# Patient Record
Sex: Female | Born: 1950
Health system: Southern US, Community
[De-identification: ages and names within clinical notes are randomized; demographics above are authoritative.]

## PROBLEM LIST (undated history)

## (undated) DIAGNOSIS — I10 Essential (primary) hypertension: Secondary | ICD-10-CM

## (undated) DIAGNOSIS — E119 Type 2 diabetes mellitus without complications: Secondary | ICD-10-CM

## (undated) DIAGNOSIS — C73 Malignant neoplasm of thyroid gland: Secondary | ICD-10-CM

## (undated) DIAGNOSIS — E785 Hyperlipidemia, unspecified: Secondary | ICD-10-CM

## (undated) HISTORY — PX: TRIGGER FINGER RELEASE: SHX641

## (undated) HISTORY — PX: JOINT REPLACEMENT: SHX530

## (undated) HISTORY — DX: Type 2 diabetes mellitus without complications: E11.9

## (undated) HISTORY — PX: HAND SURGERY: SHX662

## (undated) HISTORY — DX: Essential (primary) hypertension: I10

## (undated) HISTORY — PX: TONSILLECTOMY: SUR1361

## (undated) HISTORY — DX: Hyperlipidemia, unspecified: E78.5

## (undated) HISTORY — DX: Malignant neoplasm of thyroid gland: C73

## (undated) HISTORY — PX: ABDOMINAL HYSTERECTOMY: SHX81

---

## 2009-10-02 HISTORY — PX: TOTAL THYROIDECTOMY: SHX2547

## 2009-10-02 HISTORY — PX: CHOLECYSTECTOMY, LAPAROSCOPIC: SHX56

## 2014-04-13 LAB — HEPATIC FUNCTION PANEL
ALK PHOS: 89 (ref 25–125)
ALT: 20 (ref 7–35)
AST: 24 (ref 13–35)
BILIRUBIN, TOTAL: 0.4

## 2014-04-13 LAB — LIPID PANEL
Cholesterol: 199 (ref 0–200)
HDL: 39 (ref 35–70)
LDL Cholesterol: 46
TRIGLYCERIDES: 164 — AB (ref 40–160)

## 2014-04-13 LAB — MICROALBUMIN, URINE: MICROALB UR: 7.3

## 2014-04-13 LAB — BASIC METABOLIC PANEL
CREATININE: 0.6 (ref 0.5–1.1)
GLUCOSE: 109
Potassium: 4 (ref 3.4–5.3)
SODIUM: 142 (ref 137–147)

## 2014-04-13 LAB — TSH: TSH: 1.84 (ref 0.41–5.90)

## 2016-11-03 LAB — VITAMIN D 25 HYDROXY (VIT D DEFICIENCY, FRACTURES): VIT D 25 HYDROXY: 66

## 2017-02-07 LAB — BASIC METABOLIC PANEL
BUN: 11 (ref 4–21)
Creatinine: 0.8 (ref 0.5–1.1)
Glucose: 141
Potassium: 4.9 (ref 3.4–5.3)
Sodium: 141 (ref 137–147)

## 2017-02-07 LAB — LIPID PANEL
CHOLESTEROL: 152 (ref 0–200)
HDL: 40 (ref 35–70)
LDL Cholesterol: 225
TRIGLYCERIDES: 225 — AB (ref 40–160)

## 2017-02-07 LAB — HEPATIC FUNCTION PANEL
ALT: 31 (ref 7–35)
AST: 34 (ref 13–35)
Alkaline Phosphatase: 58 (ref 25–125)
Bilirubin, Total: 0.6

## 2017-02-07 LAB — TSH: TSH: 2.35 (ref 0.41–5.90)

## 2017-02-07 LAB — HEMOGLOBIN A1C: Hemoglobin A1C: 6.7

## 2017-02-28 ENCOUNTER — Encounter (INDEPENDENT_AMBULATORY_CARE_PROVIDER_SITE_OTHER): Payer: Self-pay

## 2017-02-28 ENCOUNTER — Ambulatory Visit (INDEPENDENT_AMBULATORY_CARE_PROVIDER_SITE_OTHER): Payer: Medicare Other | Admitting: Neurology

## 2017-02-28 ENCOUNTER — Ambulatory Visit (INDEPENDENT_AMBULATORY_CARE_PROVIDER_SITE_OTHER): Payer: Self-pay | Admitting: Neurology

## 2017-02-28 DIAGNOSIS — G5600 Carpal tunnel syndrome, unspecified upper limb: Secondary | ICD-10-CM

## 2017-02-28 DIAGNOSIS — G5603 Carpal tunnel syndrome, bilateral upper limbs: Secondary | ICD-10-CM

## 2017-02-28 DIAGNOSIS — M25512 Pain in left shoulder: Secondary | ICD-10-CM

## 2017-02-28 DIAGNOSIS — Z0289 Encounter for other administrative examinations: Secondary | ICD-10-CM

## 2017-02-28 NOTE — Progress Notes (Signed)
See procedure note.

## 2017-03-01 NOTE — Progress Notes (Signed)
Full Name: Laura Davis Gender: Female MRN #: [id number] Date of Birth: November 28, 1950    Visit Date: 02/28/2017 10:53 Age: 66 Years 1 Months Old Examining Physician: Sarina Ill, MD  Referring Physician: Marshall Cork, MD    History: This is a 66 year old patient here for evaluation of pain in the left shoulder.  Summary: Nerve Conduction Studies were performed on the bilateral upper extremities.  The right median APB motor nerve showed prolonged distal onset latency (4.4 ms, N<4.4). The right Median 2nd Digit orthodromic sensory nerve showed prolonged distal peak latency (3.75 ms, N<3.4). The left median APB motor nerve showed prolonged distal onset latency (4.6 ms, N<4.4). The right median/ulnar (palm) comparison nerve showed prolonged distal peak latency (Median Palm, 2.76 ms, N<2.2) and abnormal peak latency difference (Median Palm-Ulnar Palm, 1.0 ms, N<0.4) with a relative median delay. The left median/ulnar (palm) comparison nerve showed prolonged distal peak latency (Median Palm, 2.5 ms, N<2.2) and abnormal peak latency difference (Median Palm-Ulnar Palm, 0.9 ms, N<0.4) with a relative median delay.The remaining nerves (as detailed in the following tables) were within normal limits. The muscles (as detailed in the following tables) were within normal limits.    Conclusion: There is electrophysiologic evidence of bilateral, moderately-severe median neuropathy at the wrist. No suggestion of polyneuropathy or radiculopathy.   Cc: Dr. Rozelle Logan M.D.  [facility] [place], [place] [address] Tel: [phone] Fax: [phone]        [facility]    Nerve / Sites Rec. Site Latency Ref. Amplitude Ref. Rel Amp Segments Distance Velocity Ref. Area    ms ms mV mV %  cm m/s m/s mVms  L Median - APB     Wrist APB 4.4 ?4.4 8.9 ?4.0 100 Wrist - APB 7   31.5     Upper arm APB 8.4  8.3  93.8 Upper arm - Wrist 22 54 ?49 30.1  R Median - APB     Wrist APB 4.6 ?4.4 4.2 ?4.0 100 Wrist - APB 7   13.8     Upper arm APB 9.2  3.7  89.8 Upper arm - Wrist 23 51 ?49 12.1  L Ulnar - ADM     Wrist ADM 2.8 ?3.3 9.7 ?6.0 100 Wrist - ADM 7   28.9     B.Elbow ADM 6.6  9.0  93.3 B.Elbow - Wrist 20 53 ?49 28.3     A.Elbow ADM 8.5  8.2  90.9 A.Elbow - B.Elbow 10 52 ?49 27.1     Axilla ADM 11.4  7.1  86 Axilla - A.Elbow 17 59  24.3     Supraclav fossa ADM 14.1  6.9  98.4 Supraclav fossa - Axilla 15 56  23.6         A.Elbow - Wrist               SNC    Nerve / Sites Rec. Site Peak Lat Ref.  Amp Ref. Segments Distance Peak Diff Ref.    ms ms V V  cm ms ms  L Median, Ulnar - Transcarpal comparison     Median Palm Wrist 2.76 ?2.20 29 ?35 Median Palm - Wrist 8       Ulnar Palm Wrist 1.72 ?2.20 8 ?12 Ulnar Palm - Wrist 8          Median Palm - Ulnar Palm  1.0 ?0.4  R Median, Ulnar - Transcarpal comparison     Median  Palm Wrist 2.50 ?2.20 48 ?35 Median Palm - Wrist 8       Ulnar Palm Wrist 1.61 ?2.20 21 ?12 Ulnar Palm - Wrist 8          Median Palm - Ulnar Palm  0.9 ?0.4  L Median - Orthodromic (Dig II, Mid palm)     Dig II Wrist 3.75 ?3.40 11 ?10 Dig II - Wrist 13    R Median - Orthodromic (Dig II, Mid palm)     Dig II Wrist 3.33 ?3.40 13 ?10 Dig II - Wrist 13    L Ulnar - Orthodromic, (Dig V, Mid palm)     Dig V Wrist 2.60 ?3.10 5 ?5 Dig V - Wrist 11    R Ulnar - Orthodromic, (Dig V, Mid palm)     Dig V Wrist 2.50 ?3.10 8 ?5 Dig V - Wrist 50                   F  Wave    Nerve F Lat Ref.   ms ms  L Ulnar - ADM 28.2 ?32.0       EMG full       EMG Summary Table    Spontaneous MUAP Recruitment  Muscle IA Fib PSW Fasc Other Amp Dur. Poly Pattern  L. Deltoid Normal None None None _______ Normal Normal Normal Normal  L. Triceps brachii Normal None None None _______ Normal Normal Normal Normal  L. Pronator teres Normal None None None _______ Normal Normal Normal Normal  L. First dorsal interosseous Normal None None None _______ Normal  Normal Normal Normal  L. Opponens pollicis Normal None None None _______ Normal Normal Normal Normal  L. infraspinatus and L. supraspinatus  Normal None None None _______ Normal Normal Normal Normal  L. Cervical paraspinals (low) Normal None None None _______ Normal Normal Normal Normal

## 2017-03-01 NOTE — Procedures (Signed)
Full Name: Laura Davis Gender: Female MRN #: [id number] Date of Birth: [birth year]    Visit Date: 02/28/2017 10:53 Age: 66 Years 1 Months Old Examining Physician: Sarina Ill, MD  Referring Physician: Marshall Cork, MD    History: This is a 66 year old patient here for evaluation of pain in the left shoulder.  Summary: Nerve Conduction Studies were performed on the bilateral upper extremities.  The right median APB motor nerve showed prolonged distal onset latency (4.4 ms, N<4.4). The right Median 2nd Digit orthodromic sensory nerve showed prolonged distal peak latency (3.75 ms, N<3.4). The left median APB motor nerve showed prolonged distal onset latency (4.6 ms, N<4.4). The right median/ulnar (palm) comparison nerve showed prolonged distal peak latency (Median Palm, 2.76 ms, N<2.2) and abnormal peak latency difference (Median Palm-Ulnar Palm, 1.0 ms, N<0.4) with a relative median delay. The left median/ulnar (palm) comparison nerve showed prolonged distal peak latency (Median Palm, 2.5 ms, N<2.2) and abnormal peak latency difference (Median Palm-Ulnar Palm, 0.9 ms, N<0.4) with a relative median delay.The remaining nerves (as detailed in the following tables) were within normal limits. The muscles (as detailed in the following tables) were within normal limits.    Conclusion: There is electrophysiologic evidence of bilateral, moderately-severe median neuropathy at the wrist. No suggestion of polyneuropathy or radiculopathy.   Cc: Dr. Rozelle Logan M.D.  [facility] [place], [place] [address] Tel: [phone] Fax: [phone]        [facility]    Nerve / Sites Rec. Site Latency Ref. Amplitude Ref. Rel Amp Segments Distance Velocity Ref. Area    ms ms mV mV %  cm m/s m/s mVms  L Median - APB     Wrist APB 4.4 ?4.4 8.9 ?4.0 100 Wrist - APB 7   31.5     Upper arm APB 8.4  8.3  93.8 Upper arm - Wrist 22 54 ?49 30.1  R Median - APB     Wrist APB 4.6 ?4.4 4.2 ?4.0 100 Wrist - APB 7   13.8     Upper arm APB 9.2  3.7  89.8 Upper arm - Wrist 23 51 ?49 12.1  L Ulnar - ADM     Wrist ADM 2.8 ?3.3 9.7 ?6.0 100 Wrist - ADM 7   28.9     B.Elbow ADM 6.6  9.0  93.3 B.Elbow - Wrist 20 53 ?49 28.3     A.Elbow ADM 8.5  8.2  90.9 A.Elbow - B.Elbow 10 52 ?49 27.1     Axilla ADM 11.4  7.1  86 Axilla - A.Elbow 17 59  24.3     Supraclav fossa ADM 14.1  6.9  98.4 Supraclav fossa - Axilla 15 56  23.6         A.Elbow - Wrist               SNC    Nerve / Sites Rec. Site Peak Lat Ref.  Amp Ref. Segments Distance Peak Diff Ref.    ms ms V V  cm ms ms  L Median, Ulnar - Transcarpal comparison     Median Palm Wrist 2.76 ?2.20 29 ?35 Median Palm - Wrist 8       Ulnar Palm Wrist 1.72 ?2.20 8 ?12 Ulnar Palm - Wrist 8          Median Palm - Ulnar Palm  1.0 ?0.4  R Median, Ulnar - Transcarpal comparison     Median  Palm Wrist 2.50 ?2.20 48 ?35 Median Palm - Wrist 8       Ulnar Palm Wrist 1.61 ?2.20 21 ?12 Ulnar Palm - Wrist 8          Median Palm - Ulnar Palm  0.9 ?0.4  L Median - Orthodromic (Dig II, Mid palm)     Dig II Wrist 3.75 ?3.40 11 ?10 Dig II - Wrist 13    R Median - Orthodromic (Dig II, Mid palm)     Dig II Wrist 3.33 ?3.40 13 ?10 Dig II - Wrist 13    L Ulnar - Orthodromic, (Dig V, Mid palm)     Dig V Wrist 2.60 ?3.10 5 ?5 Dig V - Wrist 11    R Ulnar - Orthodromic, (Dig V, Mid palm)     Dig V Wrist 2.50 ?3.10 8 ?5 Dig V - Wrist 71                   F  Wave    Nerve F Lat Ref.   ms ms  L Ulnar - ADM 28.2 ?32.0       EMG full       EMG Summary Table    Spontaneous MUAP Recruitment  Muscle IA Fib PSW Fasc Other Amp Dur. Poly Pattern  L. Deltoid Normal None None None _______ Normal Normal Normal Normal  L. Triceps brachii Normal None None None _______ Normal Normal Normal Normal  L. Pronator teres Normal None None None _______ Normal Normal Normal Normal  L. First dorsal interosseous Normal None None None _______ Normal  Normal Normal Normal  L. Opponens pollicis Normal None None None _______ Normal Normal Normal Normal  L. infraspinatus and L. supraspinatus  Normal None None None _______ Normal Normal Normal Normal  L. Cervical paraspinals (low) Normal None None None _______ Normal Normal Normal Normal

## 2017-08-09 ENCOUNTER — Encounter: Payer: Self-pay | Admitting: Family Medicine

## 2017-08-09 ENCOUNTER — Ambulatory Visit (INDEPENDENT_AMBULATORY_CARE_PROVIDER_SITE_OTHER): Payer: Medicare Other | Admitting: Family Medicine

## 2017-08-09 VITALS — BP 116/80 | HR 88 | Temp 98.1°F | Resp 16 | Ht 64.0 in | Wt 179.0 lb

## 2017-08-09 DIAGNOSIS — K219 Gastro-esophageal reflux disease without esophagitis: Secondary | ICD-10-CM | POA: Diagnosis not present

## 2017-08-09 DIAGNOSIS — Z7689 Persons encountering health services in other specified circumstances: Secondary | ICD-10-CM | POA: Diagnosis not present

## 2017-08-09 DIAGNOSIS — G5602 Carpal tunnel syndrome, left upper limb: Secondary | ICD-10-CM | POA: Diagnosis not present

## 2017-08-09 DIAGNOSIS — G2581 Restless legs syndrome: Secondary | ICD-10-CM

## 2017-08-09 DIAGNOSIS — Z800 Family history of malignant neoplasm of digestive organs: Secondary | ICD-10-CM

## 2017-08-09 DIAGNOSIS — E119 Type 2 diabetes mellitus without complications: Secondary | ICD-10-CM | POA: Diagnosis not present

## 2017-08-09 DIAGNOSIS — E890 Postprocedural hypothyroidism: Secondary | ICD-10-CM | POA: Diagnosis not present

## 2017-08-09 DIAGNOSIS — Z6830 Body mass index (BMI) 30.0-30.9, adult: Secondary | ICD-10-CM

## 2017-08-09 DIAGNOSIS — I10 Essential (primary) hypertension: Secondary | ICD-10-CM | POA: Diagnosis not present

## 2017-08-09 DIAGNOSIS — G5600 Carpal tunnel syndrome, unspecified upper limb: Secondary | ICD-10-CM | POA: Insufficient documentation

## 2017-08-09 DIAGNOSIS — E669 Obesity, unspecified: Secondary | ICD-10-CM

## 2017-08-09 DIAGNOSIS — M810 Age-related osteoporosis without current pathological fracture: Secondary | ICD-10-CM

## 2017-08-09 DIAGNOSIS — E785 Hyperlipidemia, unspecified: Secondary | ICD-10-CM

## 2017-08-09 DIAGNOSIS — E559 Vitamin D deficiency, unspecified: Secondary | ICD-10-CM

## 2017-08-09 MED ORDER — RANITIDINE HCL 300 MG PO TABS: 300.0000 mg | ORAL_TABLET | Freq: Every day | ORAL | 3 refills | Status: DC

## 2017-08-09 MED ORDER — METFORMIN HCL ER 750 MG PO TB24: 750.0000 mg | ORAL_TABLET | Freq: Every day | ORAL | 3 refills | Status: DC

## 2017-08-09 NOTE — Progress Notes (Signed)
Patient: Laura Davis Female    DOB: Jan 22, 1951   66 y.o.   MRN: [id number] Visit Date: 08/10/2017  Today's Provider: Lavon Paganini, MD   Chief Complaint  Patient presents with  . Establish Care   Subjective:    HPI   Laura Davis presents to establish care. She has DM, HTN, hyperlipidemia, and a H/O thyroid cancer (s/p thyroidectomy). She was previously seeing [facility] for her PCP, but was advised they are no longer accepting Medicare patients. She agrees to a flu shot, and states she had her PNA vaccines in [place]. Per pt, last colonoscopy was in 2014. Repeat every 5 years due to family H/O colon cancer. Last mammogram several years ago per pt.  T2DM: Taking Tradjenta and Metformin.  Tolerating well.    HTN: Taking losartan, no med SEs, BPs well controlled on home readings  HLD: taking pravastatin  RLS: Taking requip.  Present since trying Zoloft after son died.  Symptoms seem well controlled.  States that she only has occasional L arm pain and jerking, nothing in her legs.  L arm numbness: present for a few months.  Got EMG, NCS by Neurology and was diagnosed with carpal tunnel syndrome L>R.  Osteoporosis: Taking raloxifene daily.  Unsure of last DEXA scan, maybe in 2012.  Has been taking raloxifene for ~1.5 years.  Also taking fish oil, Ca/Vit D    Allergies  Allergen Reactions  . Molds & Smuts Other (See Comments)    Sneezing, itchy eyes     Current Outpatient Medications:  .  calcium citrate (CALCITRATE - DOSED IN MG ELEMENTAL CALCIUM) 950 MG tablet, Take 200 mg of elemental calcium daily by mouth., Disp: , Rfl:  .  Cholecalciferol (VITAMIN D PO), Take by mouth., Disp: , Rfl:  .  Coenzyme Q10 (CO Q 10 PO), Take by mouth., Disp: , Rfl:  .  Cyanocobalamin (VITAMIN B-12 PO), Take by mouth., Disp: , Rfl:  .  levothyroxine (SYNTHROID, LEVOTHROID) 125 MCG tablet, TK 1 T PO QAM, Disp: , Rfl: 1 .  losartan (COZAAR) 100 MG tablet, TK 1 T  PO QD, Disp: , Rfl: 2 .  metFORMIN (GLUCOPHAGE-XR) 750 MG 24 hr tablet, Take 1 tablet (750 mg total) daily with breakfast by mouth., Disp: 90 tablet, Rfl: 3 .  Multiple Vitamin (MULTIVITAMIN) capsule, Take 1 capsule daily by mouth., Disp: , Rfl:  .  Multiple Vitamins-Minerals (ZINC PO), Take by mouth., Disp: , Rfl:  .  naproxen sodium (ALEVE) 220 MG tablet, Take 440 mg at bedtime by mouth., Disp: , Rfl:  .  Omega-3 Fatty Acids (FISH OIL PO), Take by mouth., Disp: , Rfl:  .  pravastatin (PRAVACHOL) 10 MG tablet, TK 1 T PO QPM, Disp: , Rfl: 0 .  Probiotic Product (PROBIOTIC PO), Take by mouth., Disp: , Rfl:  .  raloxifene (EVISTA) 60 MG tablet, TK 1 T PO QD, Disp: , Rfl: 5 .  ranitidine (ZANTAC) 300 MG tablet, Take 1 tablet (300 mg total) at bedtime by mouth., Disp: 90 tablet, Rfl: 3 .  rOPINIRole (REQUIP) 0.5 MG tablet, TK 1 T PO BID, Disp: , Rfl: 5 .  TRADJENTA 5 MG TABS tablet, TK 1 T PO QD, Disp: , Rfl: 5  Review of Systems  Constitutional: Negative.   HENT: Positive for rhinorrhea. Negative for congestion, dental problem, drooling, ear discharge, ear pain, facial swelling, hearing loss, mouth sores, nosebleeds, postnasal drip, sinus pressure, sinus pain, sneezing, sore throat,  tinnitus, trouble swallowing and voice change.   Eyes: Negative.   Respiratory: Negative.   Cardiovascular: Negative.   Gastrointestinal: Negative.   Endocrine: Positive for cold intolerance. Negative for heat intolerance, polydipsia, polyphagia and polyuria.  Genitourinary: Positive for dyspareunia. Negative for decreased urine volume, difficulty urinating, dysuria, enuresis, flank pain, frequency, genital sores, hematuria, menstrual problem, pelvic pain, urgency, vaginal bleeding, vaginal discharge and vaginal pain.  Musculoskeletal: Positive for arthralgias. Negative for back pain, gait problem, joint swelling, myalgias, neck pain and neck stiffness.  Skin: Negative.   Allergic/Immunologic: Negative.     Neurological: Positive for numbness. Negative for dizziness, tremors, seizures, syncope, facial asymmetry, speech difficulty, weakness, light-headedness and headaches.  Hematological: Negative.   Psychiatric/Behavioral: Negative.    Past Medical History:  Diagnosis Date  . Hyperlipidemia   . Hypertension   . T2DM (type 2 diabetes mellitus) ([place])   . Thyroid cancer [facility])    Past Surgical History:  Procedure Laterality Date  . ABDOMINAL HYSTERECTOMY     unsure if cervix remains, but has not been getting pap smears  . CHOLECYSTECTOMY, LAPAROSCOPIC  2011  . HAND SURGERY    . JOINT REPLACEMENT     right knee  . TONSILLECTOMY    . TOTAL THYROIDECTOMY  2011  . TRIGGER FINGER RELEASE     Family History  Problem Relation Age of Onset  . COPD Mother        smoker  . Diabetes Father   . Heart disease Father   . Colon cancer Sister 59  . Colon cancer Brother 28  . Cancer Maternal Grandmother        stomach  . Cancer Paternal Grandmother        thyroid, breast and stomach    Social History   Tobacco Use  . Smoking status: Never Smoker  . Smokeless tobacco: Never Used  Substance Use Topics  . Alcohol use: No    Frequency: Never   Objective:   BP 116/80 (BP Location: Left Arm, Patient Position: Sitting, Cuff Size: Normal)   Pulse 88   Temp 98.1 F (36.7 C) (Oral)   Resp 16   Ht 5\' 4"  (1.626 m)   Wt 179 lb (81.2 kg)   BMI 30.73 kg/m  Vitals:   08/09/17 1507  BP: 116/80  Pulse: 88  Resp: 16  Temp: 98.1 F (36.7 C)  TempSrc: Oral  Weight: 179 lb (81.2 kg)  Height: 5\' 4"  (1.626 m)     Physical Exam  Constitutional: She is oriented to person, place, and time. She appears well-developed and well-nourished. No distress.  HENT:  Head: Normocephalic and atraumatic.  Right Ear: External ear normal.  Left Ear: External ear normal.  Nose: Nose normal.  Mouth/Throat: Oropharynx is clear and moist. No oropharyngeal exudate.  Eyes: Conjunctivae and EOM are normal.  Pupils are equal, round, and reactive to light. No scleral icterus.  Neck: Neck supple. No thyromegaly present.  Cardiovascular: Normal rate, regular rhythm, normal heart sounds and intact distal pulses.  No murmur heard. Pulmonary/Chest: Breath sounds normal. No respiratory distress. She has no wheezes. She has no rales.  Abdominal: Soft. Bowel sounds are normal. She exhibits no distension. There is no tenderness. There is no rebound and no guarding.  Musculoskeletal: She exhibits no edema or deformity.  No shoulder deformity, TTP.  No neck TTP.  L wrist +Phalens with radiation of pain proximally to mid upper arm.  This reporduced patient's pain.  Lymphadenopathy:    She has no  cervical adenopathy.  Neurological: She is alert and oriented to person, place, and time.  Skin: Skin is warm and dry. No rash noted.  Psychiatric: She has a normal mood and affect. Her behavior is normal.  Vitals reviewed.       Assessment & Plan:      Problem List Items Addressed This Visit      Cardiovascular and Mediastinum   HTN (hypertension)    Well-controlled Continue meds Check labs Follow-up in 3-6 months      Relevant Medications   pravastatin (PRAVACHOL) 10 MG tablet   losartan (COZAAR) 100 MG tablet   Other Relevant Orders   Comprehensive metabolic panel     Digestive   GERD (gastroesophageal reflux disease)    Well-controlled Continue ranitidine      Relevant Medications   Probiotic Product (PROBIOTIC PO)   ranitidine (ZANTAC) 300 MG tablet   Other Relevant Orders   CBC     Endocrine   T2DM (type 2 diabetes mellitus) ([place])    Continue current meds Check A1c Follow-up in one month for diabetes visit where we will update her health maintenance      Relevant Medications   TRADJENTA 5 MG TABS tablet   pravastatin (PRAVACHOL) 10 MG tablet   losartan (COZAAR) 100 MG tablet   metFORMIN (GLUCOPHAGE-XR) 750 MG 24 hr tablet   Other Relevant Orders   Hemoglobin A1c    Postoperative hypothyroidism    Currently asymptomatic Recheck TSH Continue current dose of Synthroid      Relevant Medications   levothyroxine (SYNTHROID, LEVOTHROID) 125 MCG tablet   Other Relevant Orders   TSH     Nervous and Auditory   Carpal tunnel syndrome    Given reproduction of symptoms with Phalen's testing of left wrist this is the likely etiology of her pain, especially given nerve conduction studies Advised follow-up with neurology Advised nighttime splinting      Relevant Medications   rOPINIRole (REQUIP) 0.5 MG tablet     Musculoskeletal and Integument   Osteoporosis    Continue raloxifene, SERM This has not been shown to decrease fracture risk, however Unsure if this previously been prescribed by PCP or GYN      Relevant Medications   raloxifene (EVISTA) 60 MG tablet   calcium citrate (CALCITRATE - DOSED IN MG ELEMENTAL CALCIUM) 950 MG tablet   Cholecalciferol (VITAMIN D PO)     Other   Family history of colon cancer    Needs every 5 year colonoscopy given strong family history Last colonoscopy in 2014 per report Requesting records from last PCP for documentation      Relevant Orders   CBC   Hyperlipidemia    Continue pravastatin Recheck lipid panel      Relevant Medications   pravastatin (PRAVACHOL) 10 MG tablet   losartan (COZAAR) 100 MG tablet   Other Relevant Orders   Lipid panel   Comprehensive metabolic panel   Restless leg syndrome    Well-controlled Continue Requip Could consider coming off of Requipin the future, as her current symptoms seem more related to her carpal tunnel      Vitamin D deficiency    Continue OTC vitamin D and calcium supplementation Recheck vitamin D level today      Relevant Orders   VITAMIN D 25 Hydroxy (Vit-D Deficiency, Fractures)   Obesity    Discussed diet and exercise Discussed healthy weight management      Relevant Medications   TRADJENTA 5 MG TABS tablet  metFORMIN (GLUCOPHAGE-XR) 750 MG  24 hr tablet    Other Visit Diagnoses    Encounter to establish care    -  Primary          Return in about 4 weeks (around 09/06/2017) for DM f/u.  The entirety of the information documented in the History of Present Illness, Review of Systems and Physical Exam were personally obtained by me. Portions of this information were initially documented by Raquel Sarna Ratchford, CMA and reviewed by me for thoroughness and accuracy.   Approximately 45 minutes was spent in discussion of which greater than 50% was consultation.    Lavon Paganini, MD  [facility]

## 2017-08-10 NOTE — Assessment & Plan Note (Signed)
Continue OTC vitamin D and calcium supplementation Recheck vitamin D level today

## 2017-08-10 NOTE — Assessment & Plan Note (Signed)
Continue raloxifene, SERM This has not been shown to decrease fracture risk, however Unsure if this previously been prescribed by PCP or GYN

## 2017-08-10 NOTE — Assessment & Plan Note (Signed)
Needs every 5 year colonoscopy given strong family history Last colonoscopy in 2014 per report Requesting records from last PCP for documentation

## 2017-08-10 NOTE — Assessment & Plan Note (Signed)
Given reproduction of symptoms with Phalen's testing of left wrist this is the likely etiology of her pain, especially given nerve conduction studies Advised follow-up with neurology Advised nighttime splinting

## 2017-08-10 NOTE — Assessment & Plan Note (Signed)
Discussed diet and exercise Discussed healthy weight management

## 2017-08-10 NOTE — Assessment & Plan Note (Signed)
Well-controlled Continue Requip Could consider coming off of Requipin the future, as her current symptoms seem more related to her carpal tunnel

## 2017-08-10 NOTE — Assessment & Plan Note (Signed)
Well controlled Continue ranitidine 

## 2017-08-10 NOTE — Assessment & Plan Note (Signed)
Well-controlled Continue meds Check labs Follow-up in 3-6 months

## 2017-08-10 NOTE — Assessment & Plan Note (Signed)
Continue current meds Check A1c Follow-up in one month for diabetes visit where we will update her health maintenance

## 2017-08-10 NOTE — Assessment & Plan Note (Signed)
Continue pravastatin Recheck lipid panel 

## 2017-08-10 NOTE — Assessment & Plan Note (Signed)
Currently asymptomatic Recheck TSH Continue current dose of Synthroid

## 2017-08-11 LAB — LIPID PANEL
CHOLESTEROL: 147 mg/dL (ref <200)
HDL: 31 mg/dL — ABNORMAL LOW (ref >50)
LDL CHOLESTEROL (CALC): 81 mg/dL
Non-HDL Cholesterol (Calc): 116 mg/dL (calc) (ref <130)
Total CHOL/HDL Ratio: 4.7 (calc) (ref <5.0)
Triglycerides: 294 mg/dL — ABNORMAL HIGH (ref <150)

## 2017-08-11 LAB — COMPLETE METABOLIC PANEL WITH GFR
AG Ratio: 1.5 (calc) (ref 1.0–2.5)
ALBUMIN MSPROF: 3.9 g/dL (ref 3.6–5.1)
ALKALINE PHOSPHATASE (APISO): 59 U/L (ref 33–130)
ALT: 39 U/L — ABNORMAL HIGH (ref 6–29)
AST: 38 U/L — ABNORMAL HIGH (ref 10–35)
BUN: 12 mg/dL (ref 7–25)
CALCIUM: 9.1 mg/dL (ref 8.6–10.4)
CO2: 27 mmol/L (ref 20–32)
CREATININE: 0.82 mg/dL (ref 0.50–0.99)
Chloride: 103 mmol/L (ref 98–110)
GFR, EST NON AFRICAN AMERICAN: 75 mL/min/{1.73_m2} (ref >=60)
GFR, Est African American: 86 mL/min/{1.73_m2} (ref >=60)
GLOBULIN: 2.6 g/dL (ref 1.9–3.7)
Glucose, Bld: 155 mg/dL — ABNORMAL HIGH (ref 65–99)
POTASSIUM: 4.1 mmol/L (ref 3.5–5.3)
Sodium: 141 mmol/L (ref 135–146)
TOTAL PROTEIN: 6.5 g/dL (ref 6.1–8.1)
Total Bilirubin: 0.4 mg/dL (ref 0.2–1.2)

## 2017-08-11 LAB — CBC
HEMATOCRIT: 39.6 % (ref 35.0–45.0)
HEMOGLOBIN: 13.5 g/dL (ref 11.7–15.5)
MCH: 31 pg (ref 27.0–33.0)
MCHC: 34.1 g/dL (ref 32.0–36.0)
MCV: 90.8 fL (ref 80.0–100.0)
MPV: 11.2 fL (ref 7.5–12.5)
Platelets: 224 10*3/uL (ref 140–400)
RBC: 4.36 10*6/uL (ref 3.80–5.10)
RDW: 12.4 % (ref 11.0–15.0)
WBC: 9.3 10*3/uL (ref 3.8–10.8)

## 2017-08-11 LAB — HEMOGLOBIN A1C
HEMOGLOBIN A1C: 7.9 %{Hb} — AB (ref <5.7)
Mean Plasma Glucose: 180 (calc)
eAG (mmol/L): 10 (calc)

## 2017-08-11 LAB — TSH: TSH: 5.06 mIU/L — ABNORMAL HIGH (ref 0.40–4.50)

## 2017-08-11 LAB — VITAMIN D 25 HYDROXY (VIT D DEFICIENCY, FRACTURES): Vit D, 25-Hydroxy: 50 ng/mL (ref 30–100)

## 2017-08-13 ENCOUNTER — Telehealth: Payer: Self-pay

## 2017-08-13 MED ORDER — EMPAGLIFLOZIN 10 MG PO TABS: 10.0000 mg | ORAL_TABLET | Freq: Every day | ORAL | 11 refills | Status: DC

## 2017-08-13 MED ORDER — LEVOTHYROXINE SODIUM 137 MCG PO TABS: 137.0000 ug | ORAL_TABLET | Freq: Every day | ORAL | 11 refills | Status: DC

## 2017-08-13 NOTE — Telephone Encounter (Signed)
Pt advised of results and agrees with treatment plan. Medications sent to pharmacy. FU already scheduled.

## 2017-08-13 NOTE — Telephone Encounter (Signed)
-----   Message from Virginia Crews, MD sent at 08/13/2017 11:22 AM EST ----- Thyroid hormone is high, meaning that synthroid dose is slightly too low.  Increase to Synthroid 113mcg daily.  Can send new Rx. Diabetes not controlled. A1c 7.9, goal <7. Would add Jardiance if patient agrees.  This is a daily medication that helps kidneys remove extra glucose from blood and eliminate it in the urine.  Typically well tolerated, does not cause much hypoglycemia.  Side effects include yeast infections and UTIs occasionally.  Would start with 10mg  daily and f/u in 1 month.  Advise patient to check fasting blood sugar daily. Liver function is very slightly high.  We will monitor this.  Would avoid alcohol intake. Other labs: kidney function, liver function, electrolytes, Blood counts, cholesterol, Vit D are good.  Virginia Crews, MD, MPH [facility] 08/13/2017 11:22 AM

## 2017-08-20 ENCOUNTER — Encounter: Payer: Self-pay | Admitting: Family Medicine

## 2017-08-20 NOTE — Progress Notes (Signed)
Records received from [facility]. Records were reviewed and important labs and records were abstracted, upadated, and scanned into the chart.  Important points include:  - Notes suggest that patient had a supracervical hysterectomy and does have remaining cervix  - Carotid doppler in 01/2016 shows 1-39% b/l ICA stenosis  - Baseline ECG scanned into chart  - DEXA results illegible  Virginia Crews, MD, MPH [facility] 08/20/2017 9:21 AM

## 2017-08-27 ENCOUNTER — Other Ambulatory Visit: Payer: Self-pay | Admitting: Family Medicine

## 2017-09-06 ENCOUNTER — Ambulatory Visit (INDEPENDENT_AMBULATORY_CARE_PROVIDER_SITE_OTHER): Payer: Medicare Other | Admitting: Family Medicine

## 2017-09-06 VITALS — BP 120/84 | HR 68 | Temp 98.0°F | Resp 16 | Wt 176.0 lb

## 2017-09-06 DIAGNOSIS — E669 Obesity, unspecified: Secondary | ICD-10-CM

## 2017-09-06 DIAGNOSIS — L600 Ingrowing nail: Secondary | ICD-10-CM | POA: Diagnosis not present

## 2017-09-06 DIAGNOSIS — Z6830 Body mass index (BMI) 30.0-30.9, adult: Secondary | ICD-10-CM | POA: Diagnosis not present

## 2017-09-06 DIAGNOSIS — E119 Type 2 diabetes mellitus without complications: Secondary | ICD-10-CM | POA: Diagnosis not present

## 2017-09-06 DIAGNOSIS — Z23 Encounter for immunization: Secondary | ICD-10-CM

## 2017-09-06 DIAGNOSIS — E890 Postprocedural hypothyroidism: Secondary | ICD-10-CM

## 2017-09-06 LAB — POCT UA - MICROALBUMIN: Microalbumin Ur, POC: 20 mg/L

## 2017-09-06 MED ORDER — ONETOUCH ULTRA 2 W/DEVICE KIT: PACK | 0 refills | Status: AC

## 2017-09-06 MED ORDER — ONETOUCH DELICA LANCETS 33G MISC: 3 refills | Status: DC

## 2017-09-06 MED ORDER — METFORMIN HCL ER 750 MG PO TB24: 750.0000 mg | ORAL_TABLET | Freq: Every day | ORAL | 3 refills | Status: DC

## 2017-09-06 MED ORDER — RALOXIFENE HCL 60 MG PO TABS: 60.0000 mg | ORAL_TABLET | Freq: Every day | ORAL | 3 refills | Status: AC

## 2017-09-06 MED ORDER — ROPINIROLE HCL 0.5 MG PO TABS: 0.5000 mg | ORAL_TABLET | Freq: Two times a day (BID) | ORAL | 3 refills | Status: AC

## 2017-09-06 MED ORDER — LOSARTAN POTASSIUM 100 MG PO TABS: 100.0000 mg | ORAL_TABLET | Freq: Every day | ORAL | 3 refills | Status: DC

## 2017-09-06 MED ORDER — PRAVASTATIN SODIUM 10 MG PO TABS: 10.0000 mg | ORAL_TABLET | Freq: Every day | ORAL | 3 refills | Status: DC

## 2017-09-06 MED ORDER — RANITIDINE HCL 300 MG PO TABS: 300.0000 mg | ORAL_TABLET | Freq: Every day | ORAL | 3 refills | Status: AC

## 2017-09-06 MED ORDER — GLUCOSE BLOOD VI STRP: ORAL_STRIP | 3 refills | Status: DC

## 2017-09-06 MED ORDER — TRADJENTA 5 MG PO TABS: 5.0000 mg | ORAL_TABLET | Freq: Every day | ORAL | 3 refills | Status: AC

## 2017-09-06 NOTE — Assessment & Plan Note (Signed)
Patient with long-standing and worsening ingrown toenail of L great toe Referral to podiatry for further management No signs of infection currently

## 2017-09-06 NOTE — Progress Notes (Signed)
Patient: Laura Davis Female    DOB: March 30, 1951   66 y.o.   MRN: [id number] Visit Date: 09/06/2017  Today's Provider: Lavon Paganini, MD   Chief Complaint  Patient presents with  . Diabetes  . Hypothyroidism   Subjective:     I, Laura Davis, CMA, am acting as scribe for Lavon Paganini, MD.   HPI      Diabetes Mellitus Type II, Follow-up:   Lab Results  Component Value Date   HGBA1C 7.9 (H) 08/10/2017   HGBA1C 6.7 02/07/2017    Last seen for diabetes 4 weeks ago.  Management since then includes adding Jardiance. She reports poor compliance with treatment. She is having side effects. Yeast Infection; pt D/C the Jardiance. Current symptoms include none and have been stable. Home blood sugar records: not being checked due to increased anxiety of DM  Episodes of hypoglycemia? no   Current Insulin Regimen: N/A Most Recent Eye Exam: I year ago at [facility] Weight trend: stable Prior visit with dietician: no Current diet: in general, a "healthy" diet   [place] exercise[place]  Pertinent Labs:    Component Value Date/Time   CHOL 147 08/10/2017 0823   TRIG 294 (H) 08/10/2017 0823   HDL 31 (L) 08/10/2017 0823   LDLCALC 225 02/07/2017   CREATININE 0.82 08/10/2017 0823    Wt Readings from Last 3 Encounters:  09/06/17 176 lb (79.8 kg)  08/09/17 179 lb (81.2 kg)    ------------------------------------------------------------------------  Hypothyroid, follow-up:  TSH  Date Value Ref Range Status  08/10/2017 5.06 (H) 0.40 - 4.50 mIU/L Final  02/07/2017 2.35 0.41 - 5.90 Final   Wt Readings from Last 3 Encounters:  09/06/17 176 lb (79.8 kg)  08/09/17 179 lb (81.2 kg)    She was last seen for hypothyroid 4 weeks ago.  Management since that visit includes increasing Synthroid to 137 mcg. She reports excellent compliance with treatment. She is not having side effects.  She is exercising. Has started walking [place]  the mall. She is experiencing heat / cold intolerance She denies change in energy level, diarrhea, nervousness, palpitations and weight changes Weight trend: stable  ------------------------------------------------------------------------   Allergies  Allergen Reactions  . Molds & Smuts Other (See Comments)    Sneezing, itchy eyes     Current Outpatient Medications:  .  calcium citrate (CALCITRATE - DOSED IN MG ELEMENTAL CALCIUM) 950 MG tablet, Take 200 mg of elemental calcium daily by mouth., Disp: , Rfl:  .  Cholecalciferol (VITAMIN D PO), Take by mouth., Disp: , Rfl:  .  Coenzyme Q10 (CO Q 10 PO), Take by mouth., Disp: , Rfl:  .  Cyanocobalamin (VITAMIN B-12 PO), Take by mouth., Disp: , Rfl:  .  levothyroxine (SYNTHROID, LEVOTHROID) 137 MCG tablet, Take 1 tablet (137 mcg total) daily before breakfast by mouth., Disp: 30 tablet, Rfl: 11 .  losartan (COZAAR) 100 MG tablet, Take 1 tablet (100 mg total) by mouth daily., Disp: 90 tablet, Rfl: 3 .  metFORMIN (GLUCOPHAGE-XR) 750 MG 24 hr tablet, Take 1 tablet (750 mg total) by mouth daily with breakfast., Disp: 90 tablet, Rfl: 3 .  Multiple Vitamin (MULTIVITAMIN) capsule, Take 1 capsule daily by mouth., Disp: , Rfl:  .  Multiple Vitamins-Minerals (ZINC PO), Take by mouth., Disp: , Rfl:  .  naproxen sodium (ALEVE) 220 MG tablet, Take 440 mg at bedtime by mouth., Disp: , Rfl:  .  Omega-3 Fatty Acids (FISH OIL  PO), Take by mouth., Disp: , Rfl:  .  pravastatin (PRAVACHOL) 10 MG tablet, Take 1 tablet (10 mg total) by mouth daily., Disp: 90 tablet, Rfl: 3 .  Probiotic Product (PROBIOTIC PO), Take by mouth., Disp: , Rfl:  .  raloxifene (EVISTA) 60 MG tablet, Take 1 tablet (60 mg total) by mouth daily., Disp: 90 tablet, Rfl: 3 .  ranitidine (ZANTAC) 300 MG tablet, Take 1 tablet (300 mg total) by mouth at bedtime., Disp: 90 tablet, Rfl: 3 .  rOPINIRole (REQUIP) 0.5 MG tablet, Take 1 tablet (0.5 mg total) by mouth 2 (two) times daily., Disp: 180  tablet, Rfl: 3 .  TRADJENTA 5 MG TABS tablet, Take 1 tablet (5 mg total) by mouth daily., Disp: 90 tablet, Rfl: 3 .  Blood Glucose Monitoring Suppl (ONE TOUCH ULTRA 2) w/Device KIT, Use as directed, Disp: 1 each, Rfl: 0 .  empagliflozin (JARDIANCE) 10 MG TABS tablet, Take 10 mg daily by mouth. (Patient not taking: Reported on 09/06/2017), Disp: 30 tablet, Rfl: 11 .  glucose blood (ONE TOUCH ULTRA TEST) test strip, Use as instructed, Disp: 100 each, Rfl: 3 .  ONETOUCH DELICA LANCETS 23T MISC, Use as directed to check blood glucose daily, Disp: 100 each, Rfl: 3  Review of Systems  Constitutional: Negative for activity change, appetite change, chills, diaphoresis, fatigue, fever and unexpected weight change.  Respiratory: Negative for shortness of breath.   Cardiovascular: Negative for chest pain, palpitations and leg swelling.  Endocrine: Positive for cold intolerance. Negative for heat intolerance, polydipsia, polyphagia and polyuria.    Social History   Tobacco Use  . Smoking status: Never Smoker  . Smokeless tobacco: Never Used  Substance Use Topics  . Alcohol use: No    Frequency: Never   Objective:   BP 120/84 (BP Location: Left Arm, Patient Position: Sitting, Cuff Size: Large)   Pulse 68   Temp 98 F (36.7 C) (Oral)   Resp 16   Wt 176 lb (79.8 kg)   BMI 30.21 kg/m  Vitals:   09/06/17 0811  BP: 120/84  Pulse: 68  Resp: 16  Temp: 98 F (36.7 C)  TempSrc: Oral  Weight: 176 lb (79.8 kg)     Physical Exam  Constitutional: She is oriented to person, place, and time. She appears well-developed and well-nourished. No distress.  HENT:  Head: Normocephalic and atraumatic.  Eyes: Conjunctivae are normal. No scleral icterus.  Neck: Neck supple. No thyromegaly present.  Cardiovascular: Normal rate, regular rhythm, normal heart sounds and intact distal pulses.  Pulmonary/Chest: Effort normal and breath sounds normal. No respiratory distress. She has no wheezes. She has no  rales.  Musculoskeletal: She exhibits no edema or deformity.  Ingrown toenail on medial nailbed of L great toe, no paronychia   Lymphadenopathy:    She has no cervical adenopathy.  Neurological: She is alert and oriented to person, place, and time.  Skin: Skin is warm and dry. No rash noted.  Psychiatric: She has a normal mood and affect. Her behavior is normal.  Vitals reviewed.   Diabetic Foot Exam - Simple   Simple Foot Form Diabetic Foot exam was performed with the following findings:  Yes 09/06/2017  8:58 AM  Visual Inspection No deformities, no ulcerations, no other skin breakdown bilaterally:  Yes Sensation Testing Intact to touch and monofilament testing bilaterally:  Yes Pulse Check Posterior Tibialis and Dorsalis pulse intact bilaterally:  Yes Comments        Assessment & Plan:  Problem List Items Addressed This Visit      Endocrine   T2DM (type 2 diabetes mellitus) ([place]) - Primary    Uncontrolled Last A1c 7.9, goal <7 D/c Jardiance due to yeast infection - suggested resuming to see if it recurs, but patient declines Continue Metformin and Tradjenta Discussed GLP1, but patient prefers to continue her increase exercise level until recheck of A1c Advised repeat eye exam Requesting records from previous PCP to include vaccine record Foot exam completed today F/u in 2 months      Relevant Medications   TRADJENTA 5 MG TABS tablet   pravastatin (PRAVACHOL) 10 MG tablet   metFORMIN (GLUCOPHAGE-XR) 750 MG 24 hr tablet   losartan (COZAAR) 100 MG tablet   Other Relevant Orders   POCT UA - Microalbumin (Completed)   Postoperative hypothyroidism    Asymptomatic Tolerating higher dose synthroid Too early to recheck TSH at this time Will plan to recheck in 2 months at DM f/u        Musculoskeletal and Integument   Ingrown toenail of left foot    Patient with long-standing and worsening ingrown toenail of L great toe Referral to podiatry for further  management No signs of infection currently      Relevant Orders   Ambulatory referral to Podiatry     Other   Obesity    Discussed diet and exercise Congratulated patient on weight loss      Relevant Medications   TRADJENTA 5 MG TABS tablet   metFORMIN (GLUCOPHAGE-XR) 750 MG 24 hr tablet    Other Visit Diagnoses    Encounter for immunization       Relevant Orders   Flu vaccine HIGH DOSE PF (Completed)      Return in about 2 months (around 11/07/2017) for diabetes and hypothyroid f/u.     The entirety of the information documented in the History of Present Illness, Review of Systems and Physical Exam were personally obtained by me. Portions of this information were initially documented by Raquel Sarna Davis, CMA and reviewed by me for thoroughness and accuracy.   Virginia Crews, MD, MPH [facility] 09/06/2017 9:27 AM

## 2017-09-06 NOTE — Assessment & Plan Note (Signed)
Discussed diet and exercise Congratulated patient on weight loss

## 2017-09-06 NOTE — Assessment & Plan Note (Signed)
Uncontrolled Last A1c 7.9, goal <7 D/c Jardiance due to yeast infection - suggested resuming to see if it recurs, but patient declines Continue Metformin and Tradjenta Discussed GLP1, but patient prefers to continue her increase exercise level until recheck of A1c Advised repeat eye exam Requesting records from previous PCP to include vaccine record Foot exam completed today F/u in 2 months

## 2017-09-06 NOTE — Assessment & Plan Note (Signed)
Asymptomatic Tolerating higher dose synthroid Too early to recheck TSH at this time Will plan to recheck in 2 months at DM f/u

## 2017-09-06 NOTE — Patient Instructions (Signed)
Diabetes Mellitus and Food It is important for you to manage your blood sugar (glucose) level. Your blood glucose level can be greatly affected by what you eat. Eating healthier foods in the appropriate amounts throughout the day at about the same time each day will help you control your blood glucose level. It can also help slow or prevent worsening of your diabetes mellitus. Healthy eating may even help you improve the level of your blood pressure and reach or maintain a healthy weight. General recommendations for healthful eating and cooking habits include:  Eating meals and snacks regularly. Avoid going long periods of time without eating to lose weight.  Eating a diet that consists mainly of plant-based foods, such as fruits, vegetables, nuts, legumes, and whole grains.  Using low-heat cooking methods, such as baking, instead of high-heat cooking methods, such as deep frying.  Work with your dietitian to make sure you understand how to use the Nutrition Facts information on food labels. How can food affect me? Carbohydrates Carbohydrates affect your blood glucose level more than any other type of food. Your dietitian will help you determine how many carbohydrates to eat at each meal and teach you how to count carbohydrates. Counting carbohydrates is important to keep your blood glucose at a healthy level, especially if you are using insulin or taking certain medicines for diabetes mellitus. Alcohol Alcohol can cause sudden decreases in blood glucose (hypoglycemia), especially if you use insulin or take certain medicines for diabetes mellitus. Hypoglycemia can be a life-threatening condition. Symptoms of hypoglycemia (sleepiness, dizziness, and disorientation) are similar to symptoms of having too much alcohol. If your health care provider has given you approval to drink alcohol, do so in moderation and use the following guidelines:  Women should not have more than one drink per day, and men  should not have more than two drinks per day. One drink is equal to: ? 12 oz of beer. ? 5 oz of wine. ? 1 oz of hard liquor.  Do not drink on an empty stomach.  Keep yourself hydrated. Have water, diet soda, or unsweetened iced tea.  Regular soda, juice, and other mixers might contain a lot of carbohydrates and should be counted.  What foods are not recommended? As you make food choices, it is important to remember that all foods are not the same. Some foods have fewer nutrients per serving than other foods, even though they might have the same number of calories or carbohydrates. It is difficult to get your body what it needs when you eat foods with fewer nutrients. Examples of foods that you should avoid that are high in calories and carbohydrates but low in nutrients include:  Trans fats (most processed foods list trans fats on the Nutrition Facts label).  Regular soda.  Juice.  Candy.  Sweets, such as cake, pie, doughnuts, and cookies.  Fried foods.  What foods can I eat? Eat nutrient-rich foods, which will nourish your body and keep you healthy. The food you should eat also will depend on several factors, including:  The calories you need.  The medicines you take.  Your weight.  Your blood glucose level.  Your blood pressure level.  Your cholesterol level.  You should eat a variety of foods, including:  Protein. ? Lean cuts of meat. ? Proteins low in saturated fats, such as fish, egg whites, and beans. Avoid processed meats.  Fruits and vegetables. ? Fruits and vegetables that may help control blood glucose levels, such as apples,   mangoes, and yams.  Dairy products. ? Choose fat-free or low-fat dairy products, such as milk, yogurt, and cheese.  Grains, bread, pasta, and rice. ? Choose whole grain products, such as multigrain bread, whole oats, and brown rice. These foods may help control blood pressure.  Fats. ? Foods containing healthful fats, such as  nuts, avocado, olive oil, canola oil, and fish.  Does everyone with diabetes mellitus have the same meal plan? Because every person with diabetes mellitus is different, there is not one meal plan that works for everyone. It is very important that you meet with a dietitian who will help you create a meal plan that is just right for you. This information is not intended to replace advice given to you by your health care provider. Make sure you discuss any questions you have with your health care provider. Document Released: 06/15/2005 Document Revised: 02/24/2016 Document Reviewed: 08/15/2013 Elsevier Interactive Patient Education  2017 [employer] Inc.  

## 2017-09-11 ENCOUNTER — Ambulatory Visit: Payer: Medicare Other | Admitting: Podiatry

## 2017-09-18 ENCOUNTER — Ambulatory Visit: Payer: Medicare Other | Admitting: Podiatry

## 2017-09-18 ENCOUNTER — Encounter: Payer: Self-pay | Admitting: Podiatry

## 2017-09-18 DIAGNOSIS — L600 Ingrowing nail: Secondary | ICD-10-CM | POA: Diagnosis not present

## 2017-09-18 MED ORDER — GENTAMICIN SULFATE 0.1 % EX CREA: 1.0000 "application " | TOPICAL_CREAM | Freq: Three times a day (TID) | CUTANEOUS | 1 refills | Status: DC

## 2017-09-18 MED ORDER — DOXYCYCLINE HYCLATE 100 MG PO TABS
100.0000 mg | ORAL_TABLET | Freq: Two times a day (BID) | ORAL | 0 refills | Status: DC
Start: 2017-09-18 — End: 2017-11-23

## 2017-09-20 NOTE — Progress Notes (Signed)
   Subjective: Patient presents today for evaluation of pain to bilateral borders of bilateral great toes that began about one month ago. Patient is concerned for possible ingrown nail. She states she has been clipping the nails out herself but cannot get them out completely. She has been soaking them in hot water as well for treatment. Patient presents today for further treatment and evaluation.   Past Medical History:  Diagnosis Date  . Hyperlipidemia   . Hypertension   . T2DM (type 2 diabetes mellitus) ([place])   . Thyroid cancer ([place])     Objective:  General: Well developed, nourished, in no acute distress, alert and oriented x3   Dermatology: Skin is warm, dry and supple bilateral. Bilateral borders of bilateral great toes appear to be erythematous with evidence of an ingrowing nails. Pain on palpation noted to the border of the nail fold. The remaining nails appear unremarkable at this time. There are no open sores, lesions.  Vascular: Dorsalis Pedis artery and Posterior Tibial artery pedal pulses palpable. No lower extremity edema noted.   Neruologic: Grossly intact via light touch bilateral.  Musculoskeletal: Muscular strength within normal limits in all groups bilateral. Normal range of motion noted to all pedal and ankle joints.   Assesement: #1 Paronychia with ingrowing nails bilateral borders of bilateral great toes #2 Pain in toes #3 Incurvated nails  Plan of Care:  1. Patient evaluated.  2. Discussed treatment alternatives and plan of care. Explained nail avulsion procedure and post procedure course to patient. 3. Patient opted for permanent partial nail avulsion.  4. Prior to procedure, local anesthesia infiltration utilized using 3 ml of a 50:50 mixture of 2% plain lidocaine and 0.5% plain marcaine in a normal hallux block fashion and a betadine prep performed.  5. Partial permanent nail avulsion with chemical matrixectomy performed using [id number] applications of phenol  followed by alcohol flush.  6. Light dressing applied. 7. Prescription for Doxycycline 100 mg #20 provided to patient. 8. Prescription for gentamicin cream provided to patient.  9. Return to clinic in 2 weeks.    Edrick Kins, DPM [facility]  Dr. Edrick Kins, [address]                                        [place], [place] [address]                Office [phone]  Fax [phone]

## 2017-09-21 ENCOUNTER — Ambulatory Visit: Payer: Medicare Other | Admitting: Podiatry

## 2017-10-05 ENCOUNTER — Encounter: Payer: Self-pay | Admitting: Podiatry

## 2017-10-05 ENCOUNTER — Ambulatory Visit: Payer: Medicare Other | Admitting: Podiatry

## 2017-10-05 DIAGNOSIS — L600 Ingrowing nail: Secondary | ICD-10-CM

## 2017-10-07 NOTE — Progress Notes (Signed)
   Subjective: Patient presents today 2 weeks post ingrown nail permanent nail avulsion procedure at the medial and lateral borders of bilateral great toes. Patient states that the toe and nail fold is feeling much better.  She reports completing the course of doxycycline.   Past Medical History:  Diagnosis Date  . Hyperlipidemia   . Hypertension   . T2DM (type 2 diabetes mellitus) ([place])   . Thyroid cancer ([place])     Objective: Skin is warm, dry and supple. Nail and respective nail fold appears to be healing appropriately. Open wound to the associated nail fold with a granular wound base and moderate amount of fibrotic tissue. Minimal drainage noted. Mild erythema around the periungual region likely due to phenol chemical matricectomy.  Assessment: #1 postop permanent partial nail avulsion medial and lateral borders of bilateral great toes #2 open wound periungual nail fold of respective digit.   Plan of care: #1 patient was evaluated  #2 debridement of open wound was performed to the periungual border of the respective toe using a currette. Antibiotic ointment and Band-Aid was applied. #3 patient is to return to clinic on a PRN basis.   Edrick Kins, DPM [facility]  Dr. Edrick Kins, [address]                                        [place], [place] [address]                Office [phone]  Fax [phone]

## 2017-11-14 ENCOUNTER — Ambulatory Visit: Payer: Medicare Other | Admitting: Family Medicine

## 2017-11-23 ENCOUNTER — Ambulatory Visit (INDEPENDENT_AMBULATORY_CARE_PROVIDER_SITE_OTHER): Payer: Medicare HMO | Admitting: Family Medicine

## 2017-11-23 ENCOUNTER — Encounter: Payer: Self-pay | Admitting: Family Medicine

## 2017-11-23 VITALS — BP 128/82 | HR 88 | Temp 98.3°F | Resp 16 | Wt 174.0 lb

## 2017-11-23 DIAGNOSIS — E890 Postprocedural hypothyroidism: Secondary | ICD-10-CM

## 2017-11-23 DIAGNOSIS — I10 Essential (primary) hypertension: Secondary | ICD-10-CM | POA: Diagnosis not present

## 2017-11-23 DIAGNOSIS — K219 Gastro-esophageal reflux disease without esophagitis: Secondary | ICD-10-CM | POA: Diagnosis not present

## 2017-11-23 DIAGNOSIS — E119 Type 2 diabetes mellitus without complications: Secondary | ICD-10-CM

## 2017-11-23 LAB — POCT GLYCOSYLATED HEMOGLOBIN (HGB A1C)
Est. average glucose Bld gHb Est-mCnc: 209
Hemoglobin A1C: 8.9

## 2017-11-23 MED ORDER — LANCETS MISC: 11 refills | Status: AC

## 2017-11-23 MED ORDER — GLUCOSE BLOOD VI STRP: ORAL_STRIP | 12 refills | Status: AC

## 2017-11-23 MED ORDER — DULAGLUTIDE 0.75 MG/0.5ML ~~LOC~~ SOAJ: 0.7500 mg | SUBCUTANEOUS | 3 refills | Status: AC

## 2017-11-23 MED ORDER — METFORMIN HCL ER (MOD) 1000 MG PO TB24: 1000.0000 mg | ORAL_TABLET | Freq: Every day | ORAL | 3 refills | Status: DC

## 2017-11-23 MED ORDER — BLOOD GLUCOSE MONITOR KIT: PACK | 0 refills | Status: AC

## 2017-11-23 MED ORDER — OMEPRAZOLE 20 MG PO CPDR: 20.0000 mg | DELAYED_RELEASE_CAPSULE | Freq: Every day | ORAL | 0 refills | Status: AC

## 2017-11-23 NOTE — Assessment & Plan Note (Signed)
Well controlled Patient to check with pharmacy to see if her batch of losartan has been recalled Continue current meds F/u in 3 months

## 2017-11-23 NOTE — Patient Instructions (Signed)
Dulaglutide injection What is this medicine? DULAGLUTIDE (DOO la GLOO tide) is used to improve blood sugar control in adults with type 2 diabetes. This medicine may be used with other oral diabetes medicines. This medicine may be used for other purposes; ask your health care provider or pharmacist if you have questions. COMMON BRAND NAME(S): TRULICITY What should I tell my health care provider before I take this medicine? They need to know if you have any of these conditions: -endocrine tumors (MEN 2) or if someone in your family had these tumors -history of pancreatitis -kidney disease -liver disease -stomach problems -thyroid cancer or if someone in your family had thyroid cancer -an unusual or allergic reaction to dulaglutide, other medicines, foods, dyes, or preservatives -pregnant or trying to get pregnant -breast-feeding How should I use this medicine? This medicine is for injection under the skin of your upper leg (thigh), stomach area, or upper arm. It is usually given once every week (every 7 days). You will be taught how to prepare and give this medicine. Use exactly as directed. Take your medicine at regular intervals. Do not take it more often than directed. If you use this medicine with insulin, you should inject this medicine and the insulin separately. Do not mix them together. Do not give the injections right next to each other. Change (rotate) injection sites with each injection. It is important that you put your used needles and syringes in a special sharps container. Do not put them in a trash can. If you do not have a sharps container, call your pharmacist or healthcare provider to get one. A special MedGuide will be given to you by the pharmacist with each prescription and refill. Be sure to read this information carefully each time. Talk to your pediatrician regarding the use of this medicine in children. Special care may be needed. Overdosage: If you think you have taken  too much of this medicine contact a poison control center or emergency room at once. NOTE: This medicine is only for you. Do not share this medicine with others. What if I miss a dose? If you miss a dose, take it as soon as you can within 3 days after the missed dose. Then take your next dose at your regular weekly time. If it has been longer than 3 days after the missed dose, do not take the missed dose. Take the next dose at your regular time. Do not take double or extra doses. If you have questions about a missed dose, contact your health care provider for advice. What may interact with this medicine? -other medicines for diabetes Many medications may cause changes in blood sugar, these include: -alcohol containing beverages -antiviral medicines for HIV or AIDS -aspirin and aspirin-like drugs -certain medicines for blood pressure, heart disease, irregular heart beat -chromium -diuretics -female hormones, such as estrogens or progestins, birth control pills -fenofibrate -gemfibrozil -isoniazid -lanreotide -female hormones or anabolic steroids -MAOIs like Carbex, Eldepryl, Marplan, Nardil, and Parnate -medicines for weight loss -medicines for allergies, asthma, cold, or cough -medicines for depression, anxiety, or psychotic disturbances -niacin -nicotine -NSAIDs, medicines for pain and inflammation, like ibuprofen or naproxen -octreotide -pasireotide -pentamidine -phenytoin -probenecid -quinolone antibiotics such as ciprofloxacin, levofloxacin, ofloxacin -some herbal dietary supplements -steroid medicines such as prednisone or cortisone -sulfamethoxazole; trimethoprim -thyroid hormones Some medications can hide the warning symptoms of low blood sugar (hypoglycemia). You may need to monitor your blood sugar more closely if you are taking one of these medications. These include: -beta-blockers,  often used for high blood pressure or heart problems (examples include atenolol,  metoprolol, propranolol) -clonidine -guanethidine -reserpine This list may not describe all possible interactions. Give your health care provider a list of all the medicines, herbs, non-prescription drugs, or dietary supplements you use. Also tell them if you smoke, drink alcohol, or use illegal drugs. Some items may interact with your medicine. What should I watch for while using this medicine? Visit your doctor or health care professional for regular checks on your progress. Drink plenty of fluids while taking this medicine. Check with your doctor or health care professional if you get an attack of severe diarrhea, nausea, and vomiting. The loss of too much body fluid can make it dangerous for you to take this medicine. A test called the HbA1C (A1C) will be monitored. This is a simple blood test. It measures your blood sugar control over the last 2 to 3 months. You will receive this test every 3 to 6 months. Learn how to check your blood sugar. Learn the symptoms of low and high blood sugar and how to manage them. Always carry a quick-source of sugar with you in case you have symptoms of low blood sugar. Examples include hard sugar candy or glucose tablets. Make sure others know that you can choke if you eat or drink when you develop serious symptoms of low blood sugar, such as seizures or unconsciousness. They must get medical help at once. Tell your doctor or health care professional if you have high blood sugar. You might need to change the dose of your medicine. If you are sick or exercising more than usual, you might need to change the dose of your medicine. Do not skip meals. Ask your doctor or health care professional if you should avoid alcohol. Many nonprescription cough and cold products contain sugar or alcohol. These can affect blood sugar. Pens should never be shared. Even if the needle is changed, sharing may result in passing of viruses like hepatitis or HIV. Wear a medical ID bracelet  or chain, and carry a card that describes your disease and details of your medicine and dosage times. What side effects may I notice from receiving this medicine? Side effects that you should report to your doctor or health care professional as soon as possible: -allergic reactions like skin rash, itching or hives, swelling of the face, lips, or tongue -breathing problems -diarrhea that continues or is severe -lump or swelling on the neck -severe nausea -signs and symptoms of infection like fever or chills; cough; sore throat; pain or trouble passing urine -signs and symptoms of low blood sugar such as feeling anxious, confusion, dizziness, increased hunger, unusually weak or tired, sweating, shakiness, cold, irritable, headache, blurred vision, fast heartbeat, loss of consciousness -signs and symptoms of kidney injury like trouble passing urine or change in the amount of urine -trouble swallowing -unusual stomach upset or pain -vomiting Side effects that usually do not require medical attention (report to your doctor or health care professional if they continue or are bothersome): -diarrhea -loss of appetite -nausea -pain, redness, or irritation at site where injected -stomach upset This list may not describe all possible side effects. Call your doctor for medical advice about side effects. You may report side effects to FDA at 1-800-FDA-1088. Where should I keep my medicine? Keep out of the reach of children. Store unopened pens in a refrigerator between 2 and 8 degrees C (36 and 46 degrees F). Do not freeze or use if the  medicine has been frozen. Protect from light and excessive heat. Store in the carton until use. Each single-dose pen can be kept at room temperature, not to exceed 30 degrees C (86 degrees F) for a total of 14 days, if needed. Throw away any unused medicine after the expiration date on the label. NOTE: This sheet is a summary. It may not cover all possible information. If  you have questions about this medicine, talk to your doctor, pharmacist, or health care provider.  2018 Elsevier/Gold Standard (2016-10-05 14:35:01)  

## 2017-11-23 NOTE — Progress Notes (Signed)
Patient: Laura Davis Female    DOB: 09/28/1951   67 y.o.   MRN: [id number] Visit Date: 11/23/2017  Today's Provider: Lavon Paganini, MD   I, Martha Clan, CMA, am acting as scribe for Lavon Paganini, MD.  Chief Complaint  Patient presents with  . Diabetes  . Hypertension   Subjective:    HPI      Diabetes Mellitus Type II, Follow-up:   Lab Results  Component Value Date   HGBA1C 7.9 (H) 08/10/2017   HGBA1C 6.7 02/07/2017    Last seen for diabetes 2 months ago.  Management since then includes increasing exercise to improve A1C before adding GLP1. She reports good compliance with treatment. States exercising has decreased in the last 2 weeks due to family issues. She is not having side effects.  Current symptoms include none and have been unchanged. Home blood sugar records: not being checked; states the One Touch Ultra 2 that was prescribed is not covered by insurance  Unable to tolerate Jardiance due to severe yeast infection  Episodes of hypoglycemia? no   Current Insulin Regimen: N/A Most Recent Eye Exam: is due this summer Weight trend: stable Prior visit with dietician: no Current diet: in general, a "healthy" diet   Current exercise: walking at mall 2-3 times per week  Pertinent Labs:    Component Value Date/Time   CHOL 147 08/10/2017 0823   TRIG 294 (H) 08/10/2017 0823   HDL 31 (L) 08/10/2017 0823   LDLCALC 225 02/07/2017   CREATININE 0.82 08/10/2017 0823    Wt Readings from Last 3 Encounters:  11/23/17 174 lb (78.9 kg)  09/06/17 176 lb (79.8 kg)  08/09/17 179 lb (81.2 kg)    ------------------------------------------------------------------------  Pt would also like to discuss Losartan. States she read that there are carcinogens in the medication. Is asking if she needs to take an alternative medication.  She is also c/o GERD. Is currently taking ranitidine 300 mg at dinner time, without relief. States the acid "is causing  my nose to burn".   Synthroid dose was increased for elevated TSH at last visit.  She reports good compliance and denies symptoms.  Allergies  Allergen Reactions  . Molds & Smuts Other (See Comments)    Sneezing, itchy eyes     Current Outpatient Medications:  .  calcium citrate (CALCITRATE - DOSED IN MG ELEMENTAL CALCIUM) 950 MG tablet, Take 200 mg of elemental calcium daily by mouth., Disp: , Rfl:  .  Cholecalciferol (VITAMIN D PO), Take by mouth., Disp: , Rfl:  .  Coenzyme Q10 (CO Q 10 PO), Take by mouth., Disp: , Rfl:  .  Cyanocobalamin (VITAMIN B-12 PO), Take by mouth., Disp: , Rfl:  .  levothyroxine (SYNTHROID, LEVOTHROID) 137 MCG tablet, Take 1 tablet (137 mcg total) daily before breakfast by mouth., Disp: 30 tablet, Rfl: 11 .  losartan (COZAAR) 100 MG tablet, Take 1 tablet (100 mg total) by mouth daily., Disp: 90 tablet, Rfl: 3 .  metFORMIN (GLUCOPHAGE-XR) 750 MG 24 hr tablet, Take 1 tablet (750 mg total) by mouth daily with breakfast., Disp: 90 tablet, Rfl: 3 .  Multiple Vitamin (MULTIVITAMIN) capsule, Take 1 capsule daily by mouth., Disp: , Rfl:  .  Multiple Vitamins-Minerals (ZINC PO), Take by mouth., Disp: , Rfl:  .  naproxen sodium (ALEVE) 220 MG tablet, Take 440 mg at bedtime by mouth., Disp: , Rfl:  .  Omega-3 Fatty Acids (FISH OIL PO), Take by mouth., Disp: , Rfl:  .  pravastatin (PRAVACHOL) 10 MG tablet, Take 1 tablet (10 mg total) by mouth daily., Disp: 90 tablet, Rfl: 3 .  Probiotic Product (PROBIOTIC PO), Take by mouth., Disp: , Rfl:  .  raloxifene (EVISTA) 60 MG tablet, Take 1 tablet (60 mg total) by mouth daily., Disp: 90 tablet, Rfl: 3 .  ranitidine (ZANTAC) 300 MG tablet, Take 1 tablet (300 mg total) by mouth at bedtime., Disp: 90 tablet, Rfl: 3 .  rOPINIRole (REQUIP) 0.5 MG tablet, Take 1 tablet (0.5 mg total) by mouth 2 (two) times daily. (Patient taking differently: Take 0.5 mg by mouth at bedtime. ), Disp: 180 tablet, Rfl: 3 .  TRADJENTA 5 MG TABS tablet, Take  1 tablet (5 mg total) by mouth daily., Disp: 90 tablet, Rfl: 3 .  Blood Glucose Monitoring Suppl (ONE TOUCH ULTRA 2) w/Device KIT, Use as directed (Patient not taking: Reported on 11/23/2017), Disp: 1 each, Rfl: 0 .  glucose blood (ONE TOUCH ULTRA TEST) test strip, Use as instructed (Patient not taking: Reported on 11/23/2017), Disp: 100 each, Rfl: 3 .  ONETOUCH DELICA LANCETS 76B MISC, Use as directed to check blood glucose daily (Patient not taking: Reported on 11/23/2017), Disp: 100 each, Rfl: 3  Review of Systems  Constitutional: Negative for activity change, appetite change, chills, diaphoresis, fatigue, fever and unexpected weight change.  HENT: Negative.   Eyes: Negative for visual disturbance.  Respiratory: Negative for shortness of breath.   Cardiovascular: Negative for chest pain, palpitations and leg swelling.  Endocrine: Negative for polydipsia, polyphagia and polyuria.  Genitourinary: Negative.   Musculoskeletal: Negative.   Neurological: Negative.   Psychiatric/Behavioral: Negative.     Social History   Tobacco Use  . Smoking status: Never Smoker  . Smokeless tobacco: Never Used  Substance Use Topics  . Alcohol use: No    Frequency: Never   Objective:   BP 128/82 (BP Location: Right Arm, Patient Position: Sitting, Cuff Size: Normal)   Pulse 88   Temp 98.3 F (36.8 C) (Oral)   Resp 16   Wt 174 lb (78.9 kg)   SpO2 98%   BMI 29.87 kg/m  Vitals:   11/23/17 0816  BP: 128/82  Pulse: 88  Resp: 16  Temp: 98.3 F (36.8 C)  TempSrc: Oral  SpO2: 98%  Weight: 174 lb (78.9 kg)     Physical Exam  Constitutional: She is oriented to person, place, and time. She appears well-developed and well-nourished. No distress.  HENT:  Head: Normocephalic and atraumatic.  Eyes: Conjunctivae are normal. No scleral icterus.  Neck: Neck supple. No thyromegaly present.  Cardiovascular: Normal rate, regular rhythm, normal heart sounds and intact distal pulses.  No murmur  heard. Pulmonary/Chest: Effort normal and breath sounds normal. No respiratory distress. She has no wheezes. She has no rales.  Musculoskeletal: She exhibits no edema.  Lymphadenopathy:    She has no cervical adenopathy.  Neurological: She is alert and oriented to person, place, and time.  Skin: Skin is warm and dry. No rash noted.  Psychiatric: She has a normal mood and affect. Her behavior is normal.  Vitals reviewed.  Results for orders placed or performed in visit on 11/23/17  POCT glycosylated hemoglobin (Hb A1C)  Result Value Ref Range   Hemoglobin A1C 8.9    Est. average glucose Bld gHb Est-mCnc 209        Assessment & Plan:     Problem List Items Addressed This Visit      Cardiovascular and Mediastinum   HTN (  hypertension)    Well controlled Patient to check with pharmacy to see if her batch of losartan has been recalled Continue current meds F/u in 3 months        Digestive   GERD (gastroesophageal reflux disease)    Uncontrolled 2wk course of PPI - would avoid long term given h.o osteoporosis Continue Zantac Referral to GI for possible EGD      Relevant Medications   omeprazole (PRILOSEC) 20 MG capsule   Other Relevant Orders   Ambulatory referral to Gastroenterology     Endocrine   T2DM (type 2 diabetes mellitus) ([place]) - Primary    Uncontrolled A1c increasing from 7.9 to 8.9 Goal A1c <7 Unable to tolerate jardiance On max dose of Tradjenta - continue Increase Metformin XR to 6389HT daily Start Trulicity weekly UTD on foot exam F/u in 3 months - can consider higher Trulicity dose if tolerating well      Relevant Medications   metFORMIN (GLUMETZA) 1000 MG (MOD) 24 hr tablet   Dulaglutide (TRULICITY) 3.42 AJ/6.8TL SOPN   Other Relevant Orders   POCT glycosylated hemoglobin (Hb A1C) (Completed)   Postoperative hypothyroidism    Asymptomatic Tolerating higher dose synthroid, asymptomatic Recheck TSH F/u in 43m     Relevant Orders   TSH        Return in about 3 months (around 02/20/2018) for chronic disease f/u.   The entirety of the information documented in the History of Present Illness, Review of Systems and Physical Exam were personally obtained by me. Portions of this information were initially documented by ERaquel SarnaRatchford, CMA and reviewed by me for thoroughness and accuracy.    BVirginia Crews MD, MPH [facility]2/22/2019 11:11 AM

## 2017-11-23 NOTE — Assessment & Plan Note (Signed)
Uncontrolled 2wk course of PPI - would avoid long term given h.o osteoporosis Continue Zantac Referral to GI for possible EGD

## 2017-11-23 NOTE — Assessment & Plan Note (Signed)
Asymptomatic Tolerating higher dose synthroid, asymptomatic Recheck TSH F/u in 45m

## 2017-11-23 NOTE — Assessment & Plan Note (Signed)
Uncontrolled A1c increasing from 7.9 to 8.9 Goal A1c <7 Unable to tolerate jardiance On max dose of Tradjenta - continue Increase Metformin XR to 1000mg  daily Start Trulicity weekly UTD on foot exam F/u in 3 months - can consider higher Trulicity dose if tolerating well

## 2017-11-24 LAB — TSH: TSH: 1.13 u[IU]/mL (ref 0.450–4.500)

## 2017-11-27 ENCOUNTER — Telehealth: Payer: Self-pay

## 2017-11-27 NOTE — Telephone Encounter (Signed)
Pt advised and agrees with plan. 

## 2017-11-27 NOTE — Telephone Encounter (Signed)
-----   Message from Virginia Crews, MD sent at 11/24/2017  9:33 AM EST ----- Thyroid function now normal.  Continue current dose of Levothyroxine  Brita Romp, Dionne Bucy, MD, MPH [facility] 11/24/2017 9:33 AM

## 2017-12-10 ENCOUNTER — Encounter: Payer: Self-pay | Admitting: Gastroenterology

## 2017-12-10 DIAGNOSIS — E039 Hypothyroidism, unspecified: Secondary | ICD-10-CM | POA: Diagnosis not present

## 2017-12-10 DIAGNOSIS — Z800 Family history of malignant neoplasm of digestive organs: Secondary | ICD-10-CM | POA: Diagnosis not present

## 2017-12-10 DIAGNOSIS — Z7689 Persons encountering health services in other specified circumstances: Secondary | ICD-10-CM | POA: Diagnosis not present

## 2017-12-10 DIAGNOSIS — K219 Gastro-esophageal reflux disease without esophagitis: Secondary | ICD-10-CM | POA: Diagnosis not present

## 2017-12-10 DIAGNOSIS — I10 Essential (primary) hypertension: Secondary | ICD-10-CM | POA: Diagnosis not present

## 2017-12-10 DIAGNOSIS — M810 Age-related osteoporosis without current pathological fracture: Secondary | ICD-10-CM | POA: Diagnosis not present

## 2017-12-10 DIAGNOSIS — E7800 Pure hypercholesterolemia, unspecified: Secondary | ICD-10-CM | POA: Diagnosis not present

## 2017-12-10 DIAGNOSIS — M159 Polyosteoarthritis, unspecified: Secondary | ICD-10-CM | POA: Diagnosis not present

## 2017-12-10 DIAGNOSIS — Z1211 Encounter for screening for malignant neoplasm of colon: Secondary | ICD-10-CM | POA: Diagnosis not present

## 2017-12-10 DIAGNOSIS — E119 Type 2 diabetes mellitus without complications: Secondary | ICD-10-CM | POA: Diagnosis not present

## 2017-12-12 DIAGNOSIS — Z800 Family history of malignant neoplasm of digestive organs: Secondary | ICD-10-CM | POA: Diagnosis not present

## 2017-12-12 DIAGNOSIS — M810 Age-related osteoporosis without current pathological fracture: Secondary | ICD-10-CM | POA: Diagnosis not present

## 2017-12-12 DIAGNOSIS — Z1211 Encounter for screening for malignant neoplasm of colon: Secondary | ICD-10-CM | POA: Diagnosis not present

## 2017-12-12 DIAGNOSIS — K219 Gastro-esophageal reflux disease without esophagitis: Secondary | ICD-10-CM | POA: Diagnosis not present

## 2017-12-12 DIAGNOSIS — E7800 Pure hypercholesterolemia, unspecified: Secondary | ICD-10-CM | POA: Diagnosis not present

## 2017-12-12 DIAGNOSIS — Z7689 Persons encountering health services in other specified circumstances: Secondary | ICD-10-CM | POA: Diagnosis not present

## 2017-12-12 DIAGNOSIS — E119 Type 2 diabetes mellitus without complications: Secondary | ICD-10-CM | POA: Diagnosis not present

## 2017-12-12 DIAGNOSIS — E039 Hypothyroidism, unspecified: Secondary | ICD-10-CM | POA: Diagnosis not present

## 2017-12-12 DIAGNOSIS — M159 Polyosteoarthritis, unspecified: Secondary | ICD-10-CM | POA: Diagnosis not present

## 2017-12-21 ENCOUNTER — Other Ambulatory Visit: Payer: Self-pay | Admitting: Internal Medicine

## 2017-12-21 DIAGNOSIS — Z1231 Encounter for screening mammogram for malignant neoplasm of breast: Secondary | ICD-10-CM

## 2018-01-01 ENCOUNTER — Ambulatory Visit: Payer: Medicare HMO | Admitting: Podiatry

## 2018-01-01 ENCOUNTER — Encounter: Payer: Self-pay | Admitting: Podiatry

## 2018-01-01 DIAGNOSIS — L600 Ingrowing nail: Secondary | ICD-10-CM | POA: Diagnosis not present

## 2018-01-01 MED ORDER — GENTAMICIN SULFATE 0.1 % EX CREA: 1.0000 "application " | TOPICAL_CREAM | Freq: Three times a day (TID) | CUTANEOUS | 1 refills | Status: AC

## 2018-01-01 NOTE — Patient Instructions (Signed)

## 2018-01-03 NOTE — Progress Notes (Signed)
   Subjective: Patient presents today for evaluation of pain to the medial border of the left great toe that began about two weeks ago. Patient is concerned for possible ingrown nail. Applying pressure to the toe increases the pain. She has not done anything for treatment. Patient presents today for further treatment and evaluation.  Past Medical History:  Diagnosis Date  . Hyperlipidemia   . Hypertension   . T2DM (type 2 diabetes mellitus) ([place])   . Thyroid cancer ([place])     Objective:  General: Well developed, nourished, in no acute distress, alert and oriented x3   Dermatology: Skin is warm, dry and supple bilateral. Medial border of the left great toe appears to be erythematous with evidence of an ingrowing nail. Pain on palpation noted to the border of the nail fold. The remaining nails appear unremarkable at this time. There are no open sores, lesions.  Vascular: Dorsalis Pedis artery and Posterior Tibial artery pedal pulses palpable. No lower extremity edema noted.   Neruologic: Grossly intact via light touch bilateral.  Musculoskeletal: Muscular strength within normal limits in all groups bilateral. Normal range of motion noted to all pedal and ankle joints.   Assesement: #1 Paronychia with ingrowing nail medial border left great toe #2 Pain in toe #3 Incurvated nail  Plan of Care:  1. Patient evaluated.  2. Discussed treatment alternatives and plan of care. Explained nail avulsion procedure and post procedure course to patient. 3. Patient opted for permanent partial nail avulsion.  4. Prior to procedure, local anesthesia infiltration utilized using 3 ml of a 50:50 mixture of 2% plain lidocaine and 0.5% plain marcaine in a normal hallux block fashion and a betadine prep performed.  5. Partial permanent nail avulsion with chemical matrixectomy performed using [id number] applications of phenol followed by alcohol flush.  6. Light dressing applied. 7. Prescription for gentamicin  cream provided to patient.  8. Return to clinic in 2 weeks.   Edrick Kins, DPM [facility]  Dr. Edrick Kins, [place]                                        [place], [place] [address]                Office [phone]  Fax [phone]

## 2018-01-08 ENCOUNTER — Telehealth: Payer: Self-pay | Admitting: Family Medicine

## 2018-01-08 DIAGNOSIS — D2271 Melanocytic nevi of right lower limb, including hip: Secondary | ICD-10-CM | POA: Diagnosis not present

## 2018-01-08 DIAGNOSIS — D2261 Melanocytic nevi of right upper limb, including shoulder: Secondary | ICD-10-CM | POA: Diagnosis not present

## 2018-01-08 DIAGNOSIS — L820 Inflamed seborrheic keratosis: Secondary | ICD-10-CM | POA: Diagnosis not present

## 2018-01-08 DIAGNOSIS — D225 Melanocytic nevi of trunk: Secondary | ICD-10-CM | POA: Diagnosis not present

## 2018-01-08 DIAGNOSIS — L821 Other seborrheic keratosis: Secondary | ICD-10-CM | POA: Diagnosis not present

## 2018-01-08 DIAGNOSIS — D2272 Melanocytic nevi of left lower limb, including hip: Secondary | ICD-10-CM | POA: Diagnosis not present

## 2018-01-08 DIAGNOSIS — D485 Neoplasm of uncertain behavior of skin: Secondary | ICD-10-CM | POA: Diagnosis not present

## 2018-01-08 DIAGNOSIS — D2262 Melanocytic nevi of left upper limb, including shoulder: Secondary | ICD-10-CM | POA: Diagnosis not present

## 2018-01-08 NOTE — Telephone Encounter (Signed)
Faxed ROI to [facility] on 11.13.18

## 2018-01-08 NOTE — Telephone Encounter (Signed)
Faxed ROI to [facility] on 11.13.18

## 2018-01-09 NOTE — Telephone Encounter (Signed)
Received 18 pages from [facility] on 10/03/17 and sent to Dr Brita Romp

## 2018-01-15 ENCOUNTER — Ambulatory Visit (INDEPENDENT_AMBULATORY_CARE_PROVIDER_SITE_OTHER): Payer: Medicare HMO | Admitting: Podiatry

## 2018-01-15 ENCOUNTER — Encounter: Payer: Self-pay | Admitting: Podiatry

## 2018-01-15 DIAGNOSIS — L600 Ingrowing nail: Secondary | ICD-10-CM

## 2018-01-18 NOTE — Progress Notes (Signed)
   Subjective: Patient presents today 2 weeks post ingrown nail permanent nail avulsion procedure to the medial border of the left great toe. Patient states that the toe and nail fold is feeling much better. She reports minimal tenderness and denies drainage. Patient is here for further evaluation and treatment.   Past Medical History:  Diagnosis Date  . Hyperlipidemia   . Hypertension   . T2DM (type 2 diabetes mellitus) ([place])   . Thyroid cancer ([place])     Objective: Skin is warm, dry and supple. Nail and respective nail fold appears to be healing appropriately. Open wound to the associated nail fold with a granular wound base and moderate amount of fibrotic tissue. Minimal drainage noted. Mild erythema around the periungual region likely due to phenol chemical matricectomy.  Assessment: #1 postop permanent partial nail avulsion medial border left great toe #2 open wound periungual nail fold of respective digit.   Plan of care: #1 patient was evaluated  #2 debridement of open wound was performed to the periungual border of the respective toe using a currette. Antibiotic ointment and Band-Aid was applied. #3 patient is to return to clinic on a PRN basis.   Edrick Kins, DPM [facility]  Dr. Edrick Kins, [address]                                        [place], [place] [address]                Office [phone]  Fax [phone]

## 2018-01-30 DIAGNOSIS — Z800 Family history of malignant neoplasm of digestive organs: Secondary | ICD-10-CM | POA: Diagnosis not present

## 2018-01-30 DIAGNOSIS — K219 Gastro-esophageal reflux disease without esophagitis: Secondary | ICD-10-CM | POA: Diagnosis not present

## 2018-02-20 ENCOUNTER — Ambulatory Visit: Payer: Self-pay | Admitting: Family Medicine

## 2018-03-12 DIAGNOSIS — K640 First degree hemorrhoids: Secondary | ICD-10-CM | POA: Diagnosis not present

## 2018-03-12 DIAGNOSIS — K620 Anal polyp: Secondary | ICD-10-CM | POA: Diagnosis not present

## 2018-03-12 DIAGNOSIS — Z800 Family history of malignant neoplasm of digestive organs: Secondary | ICD-10-CM | POA: Diagnosis not present

## 2018-03-12 DIAGNOSIS — K219 Gastro-esophageal reflux disease without esophagitis: Secondary | ICD-10-CM | POA: Diagnosis not present

## 2018-03-12 DIAGNOSIS — K210 Gastro-esophageal reflux disease with esophagitis: Secondary | ICD-10-CM | POA: Diagnosis not present

## 2018-03-12 DIAGNOSIS — K529 Noninfective gastroenteritis and colitis, unspecified: Secondary | ICD-10-CM | POA: Diagnosis not present

## 2018-03-12 DIAGNOSIS — K2970 Gastritis, unspecified, without bleeding: Secondary | ICD-10-CM | POA: Diagnosis not present

## 2018-03-12 DIAGNOSIS — K648 Other hemorrhoids: Secondary | ICD-10-CM | POA: Diagnosis not present

## 2018-03-12 DIAGNOSIS — K2950 Unspecified chronic gastritis without bleeding: Secondary | ICD-10-CM | POA: Diagnosis not present

## 2018-03-12 DIAGNOSIS — K5289 Other specified noninfective gastroenteritis and colitis: Secondary | ICD-10-CM | POA: Diagnosis not present

## 2018-03-12 DIAGNOSIS — D126 Benign neoplasm of colon, unspecified: Secondary | ICD-10-CM | POA: Diagnosis not present

## 2018-03-12 DIAGNOSIS — K621 Rectal polyp: Secondary | ICD-10-CM | POA: Diagnosis not present

## 2018-03-12 DIAGNOSIS — E119 Type 2 diabetes mellitus without complications: Secondary | ICD-10-CM | POA: Diagnosis not present

## 2018-03-12 DIAGNOSIS — I10 Essential (primary) hypertension: Secondary | ICD-10-CM | POA: Diagnosis not present

## 2018-03-12 DIAGNOSIS — K3189 Other diseases of stomach and duodenum: Secondary | ICD-10-CM | POA: Diagnosis not present

## 2018-03-12 DIAGNOSIS — K319 Disease of stomach and duodenum, unspecified: Secondary | ICD-10-CM | POA: Diagnosis not present

## 2018-03-22 ENCOUNTER — Other Ambulatory Visit: Payer: Self-pay | Admitting: Family Medicine

## 2018-04-11 DIAGNOSIS — G2581 Restless legs syndrome: Secondary | ICD-10-CM | POA: Diagnosis not present

## 2018-04-11 DIAGNOSIS — Z0000 Encounter for general adult medical examination without abnormal findings: Secondary | ICD-10-CM | POA: Diagnosis not present

## 2018-04-11 DIAGNOSIS — Z85850 Personal history of malignant neoplasm of thyroid: Secondary | ICD-10-CM | POA: Diagnosis not present

## 2018-04-11 DIAGNOSIS — M810 Age-related osteoporosis without current pathological fracture: Secondary | ICD-10-CM | POA: Diagnosis not present

## 2018-04-11 DIAGNOSIS — E7800 Pure hypercholesterolemia, unspecified: Secondary | ICD-10-CM | POA: Diagnosis not present

## 2018-04-11 DIAGNOSIS — Z1231 Encounter for screening mammogram for malignant neoplasm of breast: Secondary | ICD-10-CM | POA: Diagnosis not present

## 2018-04-11 DIAGNOSIS — I10 Essential (primary) hypertension: Secondary | ICD-10-CM | POA: Diagnosis not present

## 2018-04-11 DIAGNOSIS — E119 Type 2 diabetes mellitus without complications: Secondary | ICD-10-CM | POA: Diagnosis not present

## 2018-05-22 ENCOUNTER — Ambulatory Visit
Admission: RE | Admit: 2018-05-22 | Discharge: 2018-05-22 | Disposition: A | Discharge status: Home / Self Care | Payer: Medicare HMO | Source: Ambulatory Visit | Attending: Internal Medicine | Admitting: Internal Medicine

## 2018-05-22 DIAGNOSIS — Z1231 Encounter for screening mammogram for malignant neoplasm of breast: Secondary | ICD-10-CM | POA: Insufficient documentation

## 2018-06-25 ENCOUNTER — Other Ambulatory Visit: Payer: Self-pay | Admitting: Family Medicine

## 2018-08-06 DIAGNOSIS — R8290 Unspecified abnormal findings in urine: Secondary | ICD-10-CM | POA: Diagnosis not present

## 2018-08-06 DIAGNOSIS — E119 Type 2 diabetes mellitus without complications: Secondary | ICD-10-CM | POA: Diagnosis not present

## 2018-08-06 DIAGNOSIS — M810 Age-related osteoporosis without current pathological fracture: Secondary | ICD-10-CM | POA: Diagnosis not present

## 2018-08-06 DIAGNOSIS — Z85850 Personal history of malignant neoplasm of thyroid: Secondary | ICD-10-CM | POA: Diagnosis not present

## 2018-08-06 DIAGNOSIS — G2581 Restless legs syndrome: Secondary | ICD-10-CM | POA: Diagnosis not present

## 2018-08-06 DIAGNOSIS — I10 Essential (primary) hypertension: Secondary | ICD-10-CM | POA: Diagnosis not present

## 2018-08-06 DIAGNOSIS — E7800 Pure hypercholesterolemia, unspecified: Secondary | ICD-10-CM | POA: Diagnosis not present

## 2018-08-13 DIAGNOSIS — M810 Age-related osteoporosis without current pathological fracture: Secondary | ICD-10-CM | POA: Diagnosis not present

## 2018-08-13 DIAGNOSIS — E7800 Pure hypercholesterolemia, unspecified: Secondary | ICD-10-CM | POA: Diagnosis not present

## 2018-08-13 DIAGNOSIS — E781 Pure hyperglyceridemia: Secondary | ICD-10-CM | POA: Diagnosis not present

## 2018-08-13 DIAGNOSIS — E039 Hypothyroidism, unspecified: Secondary | ICD-10-CM | POA: Diagnosis not present

## 2018-08-13 DIAGNOSIS — E1165 Type 2 diabetes mellitus with hyperglycemia: Secondary | ICD-10-CM | POA: Diagnosis not present

## 2018-08-13 DIAGNOSIS — Z85850 Personal history of malignant neoplasm of thyroid: Secondary | ICD-10-CM | POA: Diagnosis not present

## 2018-08-13 DIAGNOSIS — I10 Essential (primary) hypertension: Secondary | ICD-10-CM | POA: Diagnosis not present

## 2018-08-13 DIAGNOSIS — Z23 Encounter for immunization: Secondary | ICD-10-CM | POA: Diagnosis not present

## 2018-08-24 ENCOUNTER — Other Ambulatory Visit: Payer: Self-pay | Admitting: Family Medicine

## 2018-09-27 ENCOUNTER — Other Ambulatory Visit: Payer: Self-pay | Admitting: Family Medicine

## 2018-10-01 ENCOUNTER — Other Ambulatory Visit: Payer: Self-pay | Admitting: Family Medicine

## 2018-10-03 ENCOUNTER — Other Ambulatory Visit: Payer: Self-pay | Admitting: Family Medicine

## 2018-10-18 ENCOUNTER — Emergency Department: Payer: Medicare HMO

## 2018-10-18 ENCOUNTER — Other Ambulatory Visit: Payer: Self-pay

## 2018-10-18 ENCOUNTER — Emergency Department
Admission: EM | Admit: 2018-10-18 | Discharge: 2018-10-18 | Disposition: A | Discharge status: Home / Self Care | Payer: Medicare HMO | Attending: Emergency Medicine | Admitting: Emergency Medicine

## 2018-10-18 DIAGNOSIS — E039 Hypothyroidism, unspecified: Secondary | ICD-10-CM | POA: Insufficient documentation

## 2018-10-18 DIAGNOSIS — R1031 Right lower quadrant pain: Secondary | ICD-10-CM | POA: Diagnosis present

## 2018-10-18 DIAGNOSIS — M5416 Radiculopathy, lumbar region: Secondary | ICD-10-CM

## 2018-10-18 DIAGNOSIS — Z79899 Other long term (current) drug therapy: Secondary | ICD-10-CM | POA: Insufficient documentation

## 2018-10-18 DIAGNOSIS — M5136 Other intervertebral disc degeneration, lumbar region: Secondary | ICD-10-CM | POA: Diagnosis not present

## 2018-10-18 DIAGNOSIS — E119 Type 2 diabetes mellitus without complications: Secondary | ICD-10-CM | POA: Insufficient documentation

## 2018-10-18 DIAGNOSIS — Z7984 Long term (current) use of oral hypoglycemic drugs: Secondary | ICD-10-CM | POA: Insufficient documentation

## 2018-10-18 DIAGNOSIS — M51369 Other intervertebral disc degeneration, lumbar region without mention of lumbar back pain or lower extremity pain: Secondary | ICD-10-CM

## 2018-10-18 DIAGNOSIS — Z96651 Presence of right artificial knee joint: Secondary | ICD-10-CM | POA: Diagnosis not present

## 2018-10-18 DIAGNOSIS — I10 Essential (primary) hypertension: Secondary | ICD-10-CM | POA: Insufficient documentation

## 2018-10-18 LAB — GLUCOSE, CAPILLARY: GLUCOSE-CAPILLARY: 163 mg/dL — AB (ref 70–99)

## 2018-10-18 MED ORDER — KETOROLAC TROMETHAMINE 30 MG/ML IJ SOLN
30.0000 mg | Freq: Once | INTRAMUSCULAR | Status: AC
  Administered 2018-10-18: 30 mg via INTRAMUSCULAR
  Filled 2018-10-18: qty 1

## 2018-10-18 MED ORDER — BACLOFEN 5 MG PO TABS: 1.0000 | ORAL_TABLET | Freq: Two times a day (BID) | ORAL | 0 refills | Status: AC

## 2018-10-18 MED ORDER — KETOROLAC TROMETHAMINE 10 MG PO TABS: 10.0000 mg | ORAL_TABLET | Freq: Three times a day (TID) | ORAL | 0 refills | Status: AC | PRN

## 2018-10-18 NOTE — ED Provider Notes (Signed)
[facility] Emergency Department Provider Note   ____________________________________________   First MD Initiated Contact with Patient 10/18/18 [phone]     (approximate)  I have reviewed the triage vital signs and the nursing notes.   HISTORY  Chief Complaint Groin Pain   HPI Laura Davis is a 68 y.o. female presents to the ED with complaint of a small area of numbness to her right anterior thigh that is intermittent.  Patient states that this began 1 week ago.  She was seen at her PCPs office at which time she was given a injection of Toradol and got relief.  She was given a prescription for tramadol which she states has not helped any and the pain has returned.  She denies any urinary symptoms.  There is been no saddle anesthesias or incontinence of bowel or bladder.  Symptoms are intermittent and did not radiate down into the remainder of her lower extremity.  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  It was after she did this that she began having symptoms.  Currently she rates her pain as a 10/10.   Past Medical History:  Diagnosis Date  . Hyperlipidemia   . Hypertension   . T2DM (type 2 diabetes mellitus) ([place])   . Thyroid cancer [facility])     Patient Active Problem List   Diagnosis Date Noted  . Ingrown toenail of left foot 09/06/2017  . GERD (gastroesophageal reflux disease) 08/09/2017  . Family history of colon cancer 08/09/2017  . T2DM (type 2 diabetes mellitus) ([place]) 08/09/2017  . HTN (hypertension) 08/09/2017  . Hyperlipidemia 08/09/2017  . Osteoporosis 08/09/2017  . Restless leg syndrome 08/09/2017  . Carpal tunnel syndrome 08/09/2017  . Vitamin D deficiency 08/09/2017  . Postoperative hypothyroidism 08/09/2017  . Obesity 08/09/2017    Past Surgical History:  Procedure Laterality Date  . ABDOMINAL HYSTERECTOMY     unsure if cervix remains,  but has not been getting pap smears  . CHOLECYSTECTOMY, LAPAROSCOPIC  2011  . HAND SURGERY    . JOINT REPLACEMENT     right knee  . TONSILLECTOMY    . TOTAL THYROIDECTOMY  2011  . TRIGGER FINGER RELEASE      Prior to Admission medications   Medication Sig Start Date End Date Taking? Authorizing Provider  Baclofen 5 MG TABS Take 1 tablet by mouth 2 (two) times daily. 10/18/18   Johnn Hai, PA-C  blood glucose meter kit and supplies KIT Dispense based on patient and insurance preference. Use to check blood sugar once daily. 11/23/17   Virginia Crews, MD  Blood Glucose Monitoring Suppl (ONE TOUCH ULTRA 2) w/Device KIT Use as directed Patient not taking: Reported on 11/23/2017 09/06/17   Virginia Crews, MD  calcium citrate (CALCITRATE - DOSED IN MG ELEMENTAL CALCIUM) 950 MG tablet Take 200 mg of elemental calcium daily by mouth.    [provider]  Cholecalciferol (VITAMIN D PO) Take by mouth.    [provider]  Coenzyme Q10 (CO Q 10 PO) Take by mouth.    [provider]  Cyanocobalamin (VITAMIN B-12 PO) Take by mouth.    [provider]  Dulaglutide (TRULICITY) 0.93 AT/5.5DD SOPN Inject 0.75 mg into the skin once a week. 11/23/17   Virginia Crews, MD  gentamicin cream (GARAMYCIN) 0.1 % Apply 1 application topically 3 (three) times daily. 01/01/18   Edrick Kins,  DPM  glucose blood test strip Use to check blood glucose once daily 11/23/17   Virginia Crews, MD  ketorolac (TORADOL) 10 MG tablet Take 1 tablet (10 mg total) by mouth every 8 (eight) hours as needed for moderate pain. 10/18/18   Johnn Hai, PA-C  Lancets MISC Use to check blood sugar once daily fasting 11/23/17   Virginia Crews, MD  levothyroxine (SYNTHROID, LEVOTHROID) 125 MCG tablet TK 1 T PO QAM 10/05/17   [provider]  levothyroxine (SYNTHROID, LEVOTHROID) 137 MCG tablet TAKE 1 TABLET(137 MCG) BY MOUTH DAILY BEFORE BREAKFAST 06/25/18   Virginia Crews, MD  losartan (COZAAR) 100 MG tablet TAKE 1 TABLET(100 MG) BY MOUTH DAILY 08/26/18   Mar Daring, PA-C  metFORMIN (GLUMETZA) 1000 MG (MOD) 24 hr tablet TAKE 1 TABLET(1000 MG) BY MOUTH DAILY WITH BREAKFAST 09/30/18   Virginia Crews, MD  Multiple Vitamin (MULTIVITAMIN) capsule Take 1 capsule daily by mouth.    [provider]  Multiple Vitamins-Minerals (ZINC PO) Take by mouth.    [provider]  naproxen sodium (ALEVE) 220 MG tablet Take 440 mg at bedtime by mouth.    [provider]  Omega-3 Fatty Acids (FISH OIL PO) Take by mouth.    [provider]  omeprazole (PRILOSEC) 20 MG capsule Take 1 capsule (20 mg total) by mouth daily. 11/23/17   Virginia Crews, MD  pantoprazole (PROTONIX) 40 MG tablet  12/12/17   [provider]  pravastatin (PRAVACHOL) 10 MG tablet TAKE 1 TABLET(10 MG) BY MOUTH DAILY 08/26/18   Mar Daring, PA-C  Probiotic Product (PROBIOTIC PO) Take by mouth.    [provider]  raloxifene (EVISTA) 60 MG tablet Take 1 tablet (60 mg total) by mouth daily. 09/06/17   Virginia Crews, MD  ranitidine (ZANTAC) 300 MG tablet Take 1 tablet (300 mg total) by mouth at bedtime. 09/06/17   Bacigalupo, Dionne Bucy, MD  rOPINIRole (REQUIP) 0.5 MG tablet Take 1 tablet (0.5 mg total) by mouth 2 (two) times daily. Patient taking differently: Take 0.5 mg by mouth at bedtime.  09/06/17   Bacigalupo, Dionne Bucy, MD  TRADJENTA 5 MG TABS tablet Take 1 tablet (5 mg total) by mouth daily. 09/06/17   Virginia Crews, MD    Allergies Latex and Molds & smuts  Family History  Problem Relation Age of Onset  . COPD Mother        smoker  . Diabetes Father   . Heart disease Father   . Colon cancer Sister 108  . Colon cancer Brother 23  . Cancer Maternal Grandmother        stomach  . Cancer Paternal Grandmother        thyroid, breast and stomach    Social History Social History   Tobacco Use  . Smoking  status: Never Smoker  . Smokeless tobacco: Never Used  Substance Use Topics  . Alcohol use: No    Frequency: Never  . Drug use: No    Review of Systems Constitutional: No fever/chills Cardiovascular: Denies chest pain. Respiratory: Denies shortness of breath. Gastrointestinal: No abdominal pain.  No nausea, no vomiting.  Genitourinary: Negative for dysuria. Musculoskeletal: Negative for back pain.  Positive for right upper thigh pain and intermittent numbness. Skin: Negative for rash. Neurological: Negative for headaches or focal weakness. ____________________________________________   PHYSICAL EXAM:  VITAL SIGNS: ED Triage Vitals [10/18/18 0917]  Enc Vitals Group     BP Marland Kitchen)  146/66     Pulse Rate 85     Resp 16     Temp 99.7 F (37.6 C)     Temp Source Oral     SpO2 98 %     Weight 165 lb (74.8 kg)     Height '5\' 5"'$  (1.651 m)     Head Circumference      Peak Flow      Pain Score 10     Pain Loc      Pain Edu?      Excl. in [place]?    Constitutional: Alert and oriented. Well appearing and in no acute distress.  Lying supine on stretcher in no apparent distress at this time. Eyes: Conjunctivae are normal.  Head: Atraumatic. Neck: No stridor.   Cardiovascular: Normal rate, regular rhythm. Grossly normal heart sounds.  Good peripheral circulation. Respiratory: Normal respiratory effort.  No retractions. Lungs CTAB. Gastrointestinal: Soft and nontender. No distention.  No CVA tenderness. Musculoskeletal: On examination of the back there is no gross deformity however range of motion is slow and guarded secondary to discomfort.  There is no point tenderness on palpation but patient is tender generally from being palpated.  Good muscle strength bilaterally.  Examination of the right hip there is no gross deformity, no rotation of the lower extremity and no shortening.  Patient is able to abduct and abduct with pain noted more with abduction.  No skin discoloration or swelling is  noted in the right hip or thigh region. Neurologic:  Normal speech and language. No gross focal neurologic deficits are appreciated. No gait instability. Skin:  Skin is warm, dry and intact.  No abrasions, erythema or ecchymosis to suggest injury. Psychiatric: Mood and affect are normal. Speech and behavior are normal.  ____________________________________________   LABS (all labs ordered are listed, but only abnormal results are displayed)  Labs Reviewed  GLUCOSE, CAPILLARY - Abnormal; Notable for the following components:      Result Value   Glucose-Capillary 163 (*)    All other components within normal limits  CBG MONITORING, ED     RADIOLOGY  ED MD interpretation:   Generative changes noted on multiple levels lumbar spine x-ray.  Official radiology report(s): Dg Lumbar Spine 2-3 Views  Result Date: 10/18/2018 CLINICAL DATA:  Right-sided groin pain for 1 week. EXAM: LUMBAR SPINE - 2-3 VIEW COMPARISON:  None. FINDINGS: No fracture or bone lesion. Grade 1 anterolisthesis of L4 on L5.  No other spondylolisthesis. Mild loss of disc height at L1-L2. Moderate loss of disc height at L2-L3, L3-L4 and L4-L5. L5-S1 disc is well preserved. There are facet degenerative changes at L4-L5 and L5-S1. Endplate spurring is noted most prominent at L2-L3. Are scattered vascular calcifications. Soft tissues otherwise unremarkable. IMPRESSION: 1. No fracture or acute finding. 2. Degenerative changes as described. Electronically Signed   By: Lajean Manes M.D.   On: 10/18/2018 10:54  ____________________________________________   PROCEDURES  Procedure(s) performed: None  Procedures  Critical Care performed: No  ____________________________________________   INITIAL IMPRESSION / ASSESSMENT AND PLAN / ED COURSE  As part of my medical decision making, I reviewed the following data within the electronic MEDICAL RECORD NUMBER Notes from prior ED visits and [place] Controlled Substance Database  Patient  presents to the ED with complaint of right upper thigh numbness that is intermittent for several weeks.  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  It was  after that that she began having difficulties.  She denies any recent injuries.  There is been no incontinence of bowel or bladder or saddle anesthesias.  She was seen at her PCP at which time she was given a Toradol injection which gave her relief for 24 hours.  On today's exam she is neurologically intact.  There was suspicion for degenerative disc disease based on history and her exam.  Lumbar spine shows multiple level degenerative disc.  Most likely patient has a nerve root impingement.  She was given Toradol 30 mg and was getting moderate relief.  We discussed a prednisone taper however her blood sugar was 163 nonfasting and after discussing this with the patient it was decided that we would continue with Toradol 10 mg every 8 hours and baclofen at at bedtime.  Should she began having muscle spasms she can increase the baclofen to twice daily if needed.  Patient is to follow-up with her PCP if any continued problems.  Patient was improved and able to ambulate with the use of her cane without any difficulties at the time of her discharge.  ____________________________________________   FINAL CLINICAL IMPRESSION(S) / ED DIAGNOSES  Final diagnoses:  Degenerative disc disease, lumbar  Lumbar nerve root impingement     ED Discharge Orders         Ordered    ketorolac (TORADOL) 10 MG tablet  Every 8 hours PRN     10/18/18 1127    Baclofen 5 MG TABS  2 times daily     10/18/18 1127           Note:  This document was prepared using Dragon voice recognition software and may include unintentional dictation errors.    Johnn Hai, PA-C 10/18/18 1328    Lavonia Drafts, MD 10/18/18 1331

## 2018-10-18 NOTE — ED Triage Notes (Signed)
Right sided groin pain X 1 week. Has xray on Tuesday and was negative. PCP advised if pain continued to come to ER. Pt alert and oriented X4, active, cooperative, pt in NAD. RR even and unlabored, color WNL.  Uses cane.

## 2018-10-18 NOTE — ED Notes (Signed)
Pt to Xray wit Xray tech.

## 2018-10-18 NOTE — Discharge Instructions (Signed)
Follow-up with your primary care provider if any continued problems.  Continue with the Toradol starting tomorrow.  The baclofen that has been called and is a muscle relaxant.  It is written for 1 every 8 hours if needed for muscle spasms.  You may also choose to take it only at bedtime if you are not having any problems.  Do not take the Unisom and baclofen together as it may cause increased drowsiness which may increase your chances of falling if you get up during the night.

## 2018-10-24 ENCOUNTER — Other Ambulatory Visit: Payer: Self-pay | Admitting: Internal Medicine

## 2018-10-24 DIAGNOSIS — R208 Other disturbances of skin sensation: Secondary | ICD-10-CM

## 2018-10-24 DIAGNOSIS — E119 Type 2 diabetes mellitus without complications: Secondary | ICD-10-CM

## 2018-10-24 DIAGNOSIS — R1031 Right lower quadrant pain: Secondary | ICD-10-CM

## 2018-10-24 DIAGNOSIS — R200 Anesthesia of skin: Secondary | ICD-10-CM

## 2018-11-03 ENCOUNTER — Ambulatory Visit
Admission: RE | Admit: 2018-11-03 | Discharge: 2018-11-03 | Disposition: A | Discharge status: Home / Self Care | Payer: Medicare HMO | Source: Ambulatory Visit | Attending: Internal Medicine | Admitting: Internal Medicine

## 2018-11-03 DIAGNOSIS — E119 Type 2 diabetes mellitus without complications: Secondary | ICD-10-CM | POA: Diagnosis present

## 2018-11-03 DIAGNOSIS — R208 Other disturbances of skin sensation: Secondary | ICD-10-CM | POA: Insufficient documentation

## 2018-11-03 DIAGNOSIS — R1031 Right lower quadrant pain: Secondary | ICD-10-CM | POA: Insufficient documentation

## 2018-11-03 DIAGNOSIS — R200 Anesthesia of skin: Secondary | ICD-10-CM

## 2018-11-03 LAB — POCT I-STAT CREATININE: Creatinine, Ser: 0.8 mg/dL (ref 0.44–1.00)

## 2018-11-03 MED ORDER — GADOBUTROL 1 MMOL/ML IV SOLN
7.0000 mL | Freq: Once | INTRAVENOUS | Status: AC | PRN
  Administered 2018-11-03: 7 mL via INTRAVENOUS

## 2018-11-23 ENCOUNTER — Other Ambulatory Visit: Payer: Self-pay | Admitting: Physician Assistant

## 2018-11-25 NOTE — Telephone Encounter (Signed)
LOV with Dr.B was a year ago.  PCP Hande, Vishwanath since 05/22/18

## 2019-12-26 ENCOUNTER — Other Ambulatory Visit: Payer: Self-pay | Admitting: Internal Medicine

## 2019-12-26 DIAGNOSIS — Z1231 Encounter for screening mammogram for malignant neoplasm of breast: Secondary | ICD-10-CM

## 2020-08-12 ENCOUNTER — Other Ambulatory Visit: Payer: Self-pay

## 2020-08-12 ENCOUNTER — Ambulatory Visit
Admission: RE | Admit: 2020-08-12 | Discharge: 2020-08-12 | Disposition: A | Discharge status: Home / Self Care | Payer: Medicare HMO | Source: Ambulatory Visit | Attending: Internal Medicine | Admitting: Internal Medicine

## 2020-08-12 DIAGNOSIS — Z1231 Encounter for screening mammogram for malignant neoplasm of breast: Secondary | ICD-10-CM | POA: Diagnosis not present

## 2021-07-01 ENCOUNTER — Other Ambulatory Visit: Payer: Self-pay | Admitting: Internal Medicine

## 2021-07-01 DIAGNOSIS — Z1231 Encounter for screening mammogram for malignant neoplasm of breast: Secondary | ICD-10-CM

## 2021-08-15 ENCOUNTER — Other Ambulatory Visit: Payer: Self-pay

## 2021-08-15 ENCOUNTER — Ambulatory Visit
Admission: RE | Admit: 2021-08-15 | Discharge: 2021-08-15 | Disposition: A | Discharge status: Home / Self Care | Payer: Medicare HMO | Source: Ambulatory Visit | Attending: Internal Medicine | Admitting: Internal Medicine

## 2021-08-15 DIAGNOSIS — Z1231 Encounter for screening mammogram for malignant neoplasm of breast: Secondary | ICD-10-CM | POA: Insufficient documentation

## 2022-11-09 ENCOUNTER — Other Ambulatory Visit: Payer: Self-pay | Admitting: Internal Medicine

## 2022-11-09 DIAGNOSIS — Z1231 Encounter for screening mammogram for malignant neoplasm of breast: Secondary | ICD-10-CM

## 2022-11-28 ENCOUNTER — Ambulatory Visit
Admission: RE | Admit: 2022-11-28 | Discharge: 2022-11-28 | Disposition: A | Discharge status: Home / Self Care | Payer: Medicare HMO | Source: Ambulatory Visit | Attending: Internal Medicine | Admitting: Internal Medicine

## 2022-11-28 DIAGNOSIS — Z1231 Encounter for screening mammogram for malignant neoplasm of breast: Secondary | ICD-10-CM | POA: Diagnosis present

## 2023-08-11 IMAGING — MG MM DIGITAL SCREENING BILAT W/ TOMO AND CAD
8 series · 8 of 24 positions shown · non-contrast
Comparison: Previous exam(s).

CLINICAL DATA: Screening.

EXAM:
DIGITAL SCREENING BILATERAL MAMMOGRAM WITH TOMOSYNTHESIS AND CAD
TECHNIQUE: Bilateral screening digital craniocaudal and mediolateral oblique
mammograms were obtained. Bilateral screening digital breast
tomosynthesis was performed. The images were evaluated with
computer-aided detection.

[R CC synth-2D]
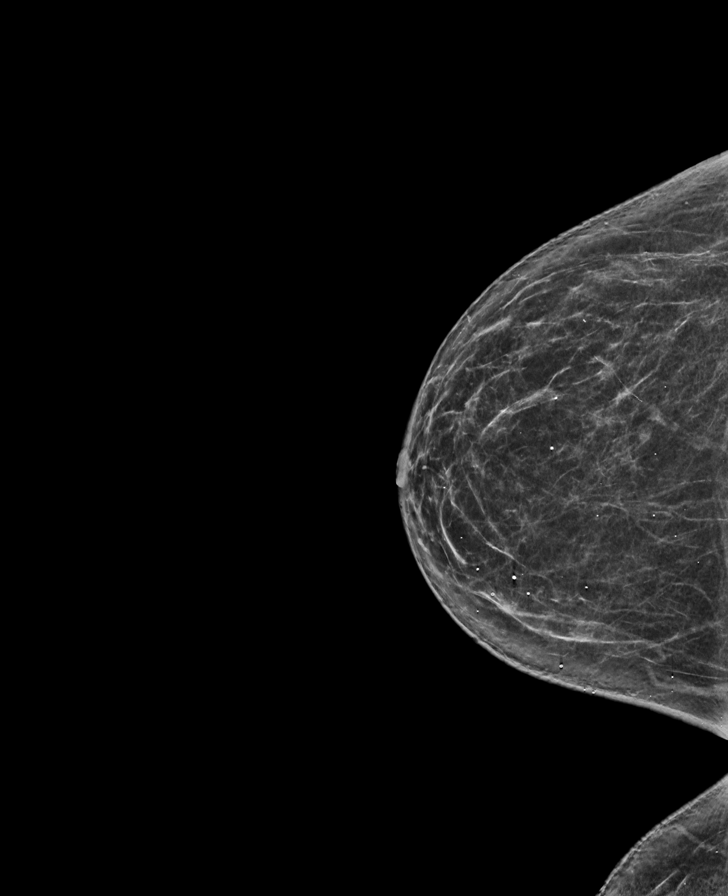

[L MLO synth-2D]
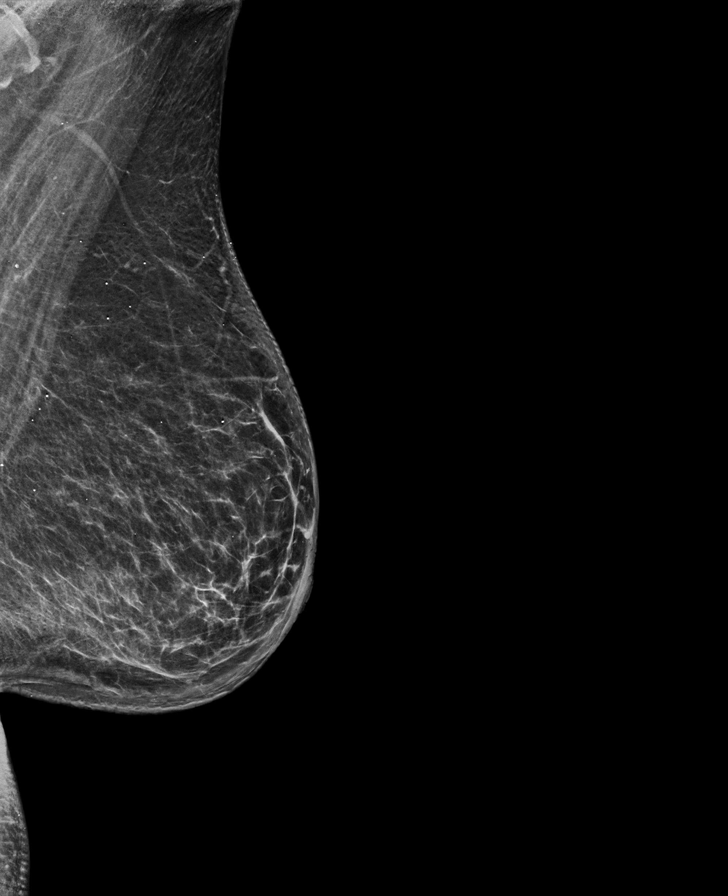

[R MLO synth-2D]
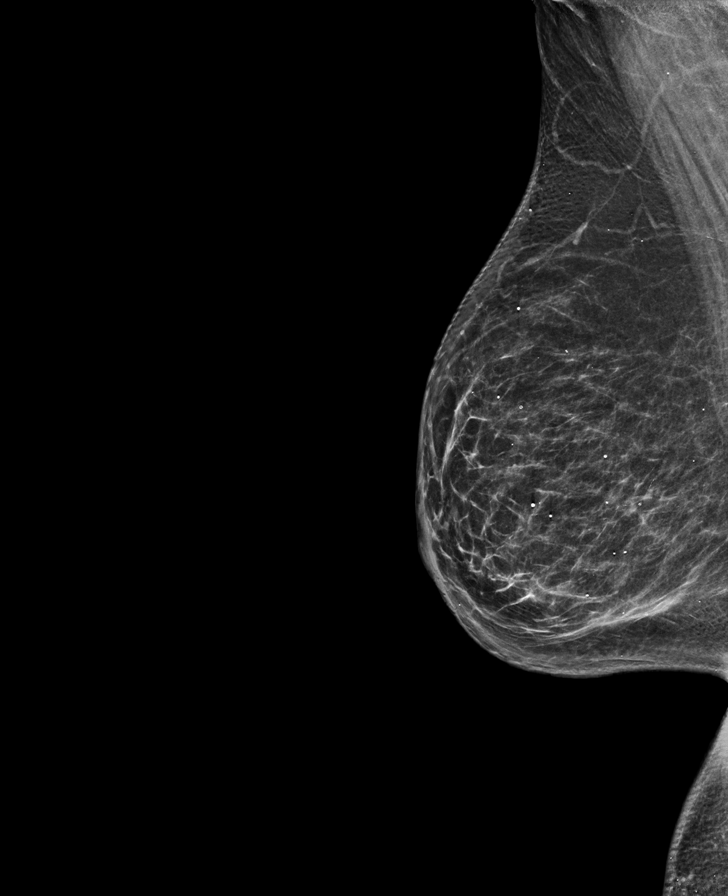

[L CC synth-2D]
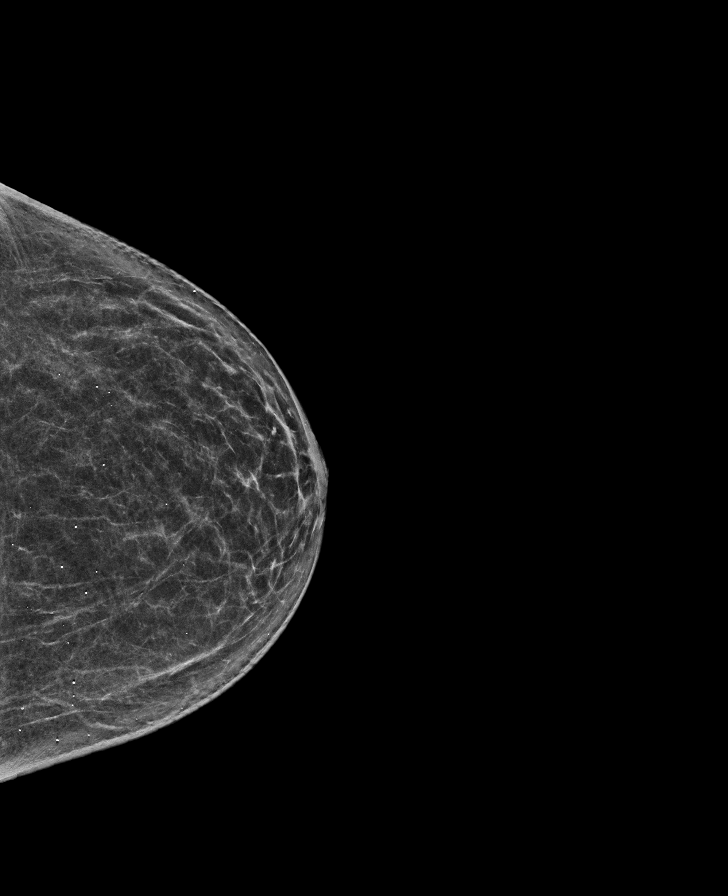

[R MLO tomo · tomo slice 33/66.0]
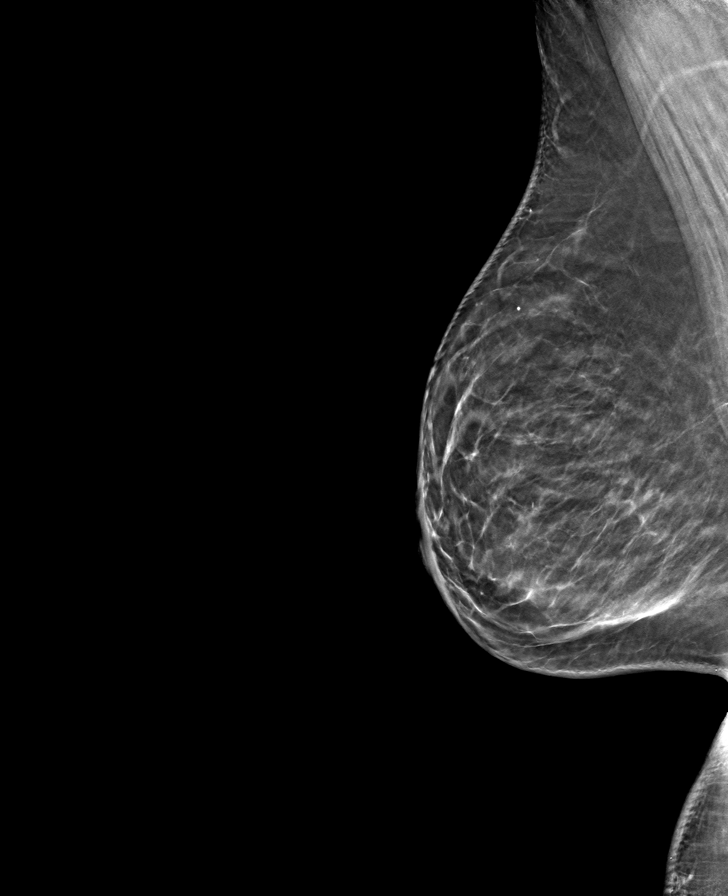

[R CC tomo · tomo slice 34/67.0]
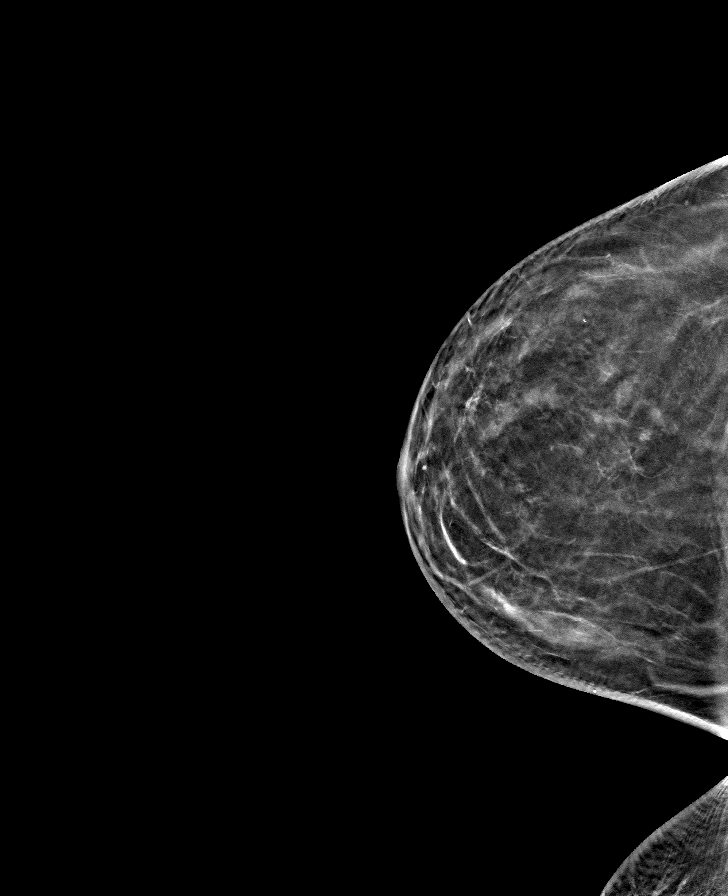

[L CC tomo · tomo slice 35/69.0]
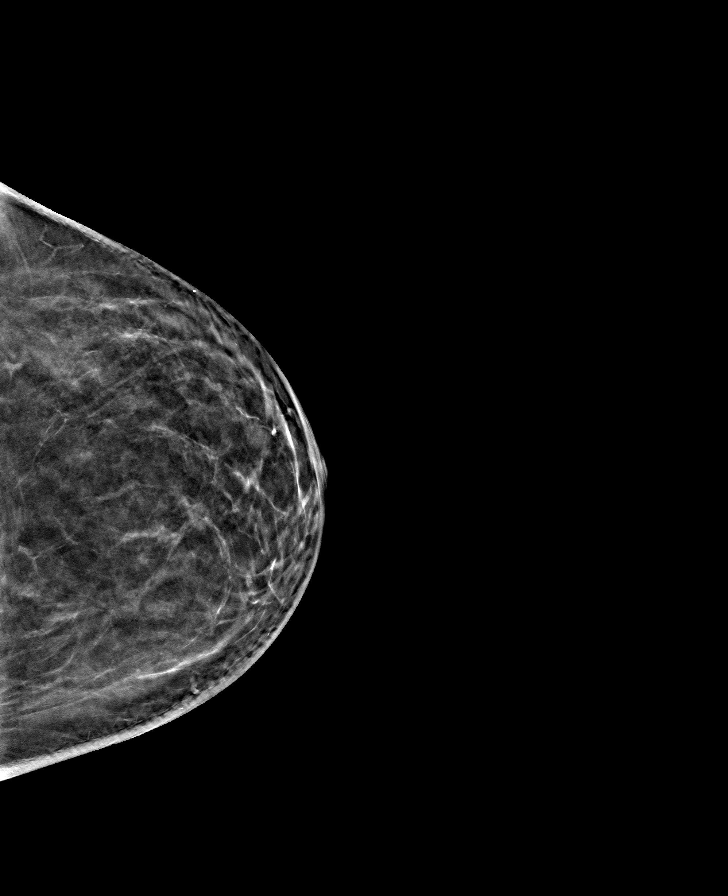

[L MLO tomo · tomo slice 37/73.0]
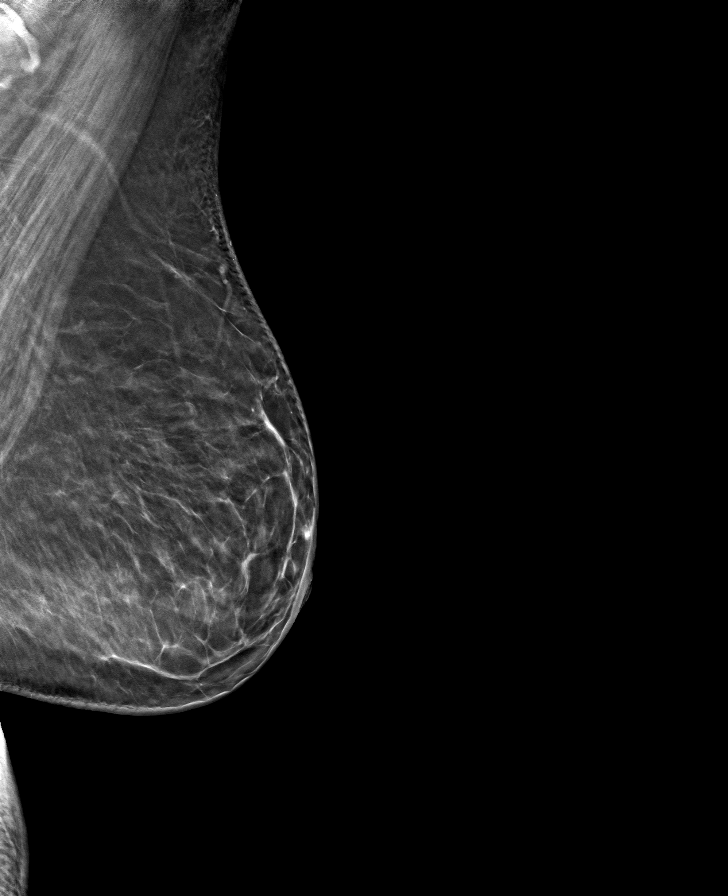

[8 of 24 positions shown; findings below may reference images not displayed]

ACR Breast Density Category b: There are scattered areas of
fibroglandular density.
FINDINGS: There are no findings suspicious for malignancy.
IMPRESSION: No mammographic evidence of malignancy. A result letter of this
screening mammogram will be mailed directly to the patient.

RECOMMENDATION:
Screening mammogram in one year. (Code:51-O-LD2)

BI-RADS CATEGORY  1: Negative.

## 2024-05-09 ENCOUNTER — Other Ambulatory Visit: Payer: Self-pay | Admitting: Internal Medicine

## 2024-05-09 DIAGNOSIS — Z1231 Encounter for screening mammogram for malignant neoplasm of breast: Secondary | ICD-10-CM
